# Patient Record
Sex: Female | Born: 1945 | Race: White | Hispanic: No | Marital: Married | Smoking: Never smoker
Health system: Southern US, Community
[De-identification: ages and names within clinical notes are randomized; demographics above are authoritative.]

## PROBLEM LIST (undated history)

## (undated) DIAGNOSIS — I10 Essential (primary) hypertension: Secondary | ICD-10-CM

## (undated) DIAGNOSIS — M1990 Unspecified osteoarthritis, unspecified site: Secondary | ICD-10-CM

## (undated) DIAGNOSIS — E7800 Pure hypercholesterolemia, unspecified: Secondary | ICD-10-CM

## (undated) DIAGNOSIS — K760 Fatty (change of) liver, not elsewhere classified: Secondary | ICD-10-CM

## (undated) DIAGNOSIS — D649 Anemia, unspecified: Secondary | ICD-10-CM

## (undated) DIAGNOSIS — R7303 Prediabetes: Secondary | ICD-10-CM

## (undated) DIAGNOSIS — I447 Left bundle-branch block, unspecified: Secondary | ICD-10-CM

## (undated) DIAGNOSIS — K219 Gastro-esophageal reflux disease without esophagitis: Secondary | ICD-10-CM

---

## 2007-03-11 ENCOUNTER — Encounter: Admission: RE | Admit: 2007-03-11 | Discharge: 2007-03-11 | Payer: Self-pay | Admitting: Internal Medicine

## 2018-10-15 ENCOUNTER — Encounter (INDEPENDENT_AMBULATORY_CARE_PROVIDER_SITE_OTHER): Payer: Self-pay | Admitting: *Deleted

## 2018-12-08 ENCOUNTER — Other Ambulatory Visit (INDEPENDENT_AMBULATORY_CARE_PROVIDER_SITE_OTHER): Payer: Self-pay | Admitting: *Deleted

## 2018-12-08 DIAGNOSIS — Z1211 Encounter for screening for malignant neoplasm of colon: Secondary | ICD-10-CM

## 2018-12-08 DIAGNOSIS — Z800 Family history of malignant neoplasm of digestive organs: Secondary | ICD-10-CM | POA: Insufficient documentation

## 2019-02-10 ENCOUNTER — Telehealth (INDEPENDENT_AMBULATORY_CARE_PROVIDER_SITE_OTHER): Payer: Self-pay | Admitting: *Deleted

## 2019-02-10 ENCOUNTER — Encounter (INDEPENDENT_AMBULATORY_CARE_PROVIDER_SITE_OTHER): Payer: Self-pay | Admitting: *Deleted

## 2019-02-10 ENCOUNTER — Other Ambulatory Visit (INDEPENDENT_AMBULATORY_CARE_PROVIDER_SITE_OTHER): Payer: Self-pay | Admitting: *Deleted

## 2019-02-10 DIAGNOSIS — Z1211 Encounter for screening for malignant neoplasm of colon: Secondary | ICD-10-CM

## 2019-02-10 MED ORDER — SUPREP BOWEL PREP KIT 17.5-3.13-1.6 GM/177ML PO SOLN: 1.0000 | Freq: Once | ORAL | 0 refills | Status: AC

## 2019-02-10 NOTE — Telephone Encounter (Signed)
Patient needs suprep TCS sch'd 12/30

## 2019-03-09 ENCOUNTER — Ambulatory Visit (INDEPENDENT_AMBULATORY_CARE_PROVIDER_SITE_OTHER): Payer: Self-pay

## 2019-03-09 ENCOUNTER — Other Ambulatory Visit: Payer: Self-pay

## 2019-03-09 ENCOUNTER — Telehealth (INDEPENDENT_AMBULATORY_CARE_PROVIDER_SITE_OTHER): Payer: Self-pay | Admitting: *Deleted

## 2019-03-09 NOTE — Telephone Encounter (Signed)
Referring MD/PCP: dianne elliott   Procedure: tcs  Reason/Indication:  Screening, fam hx colon ca  Has patient had this procedure before?  Yes, 2010  If so, when, by whom and where?    Is there a family history of colon cancer?  Yes, aunts & uncles  Who?  What age when diagnosed?    Is patient diabetic?   no      Does patient have prosthetic heart valve or mechanical valve?  no  Do you have a pacemaker/defibrillator?  no  Has patient ever had endocarditis/atrial fibrillation? no  Does patient use oxygen? no  Has patient had joint replacement within last 12 months?  no  Is patient constipated or do they take laxatives? no  Does patient have a history of alcohol/drug use?  no  Is patient on blood thinner such as Coumadin, Plavix and/or Aspirin? yes  Medications: asa 81 mg daily, prevacid 30 mg daily, hctz 25 mg 1/2 tab daily, lisinopril 40 mg bid, carvedilol 12.5 mg bid, women's one a day vitamin, atorvastatin 10 mg daily, calcium 600 mg bid, fish oil daily, miralax daily   Allergies: pcn, codeine, cipro, clindamycin  Medication Adjustment per Dr Laural Golden: asa 2 days  Procedure date & time: 04/08/19 at [id number]

## 2019-03-16 NOTE — Telephone Encounter (Signed)
Colonoscopy with conscious sedation 

## 2019-04-01 ENCOUNTER — Other Ambulatory Visit: Payer: Self-pay

## 2019-04-06 ENCOUNTER — Other Ambulatory Visit (HOSPITAL_COMMUNITY)
Admission: RE | Admit: 2019-04-06 | Discharge: 2019-04-06 | Disposition: A | Discharge status: Home / Self Care | Payer: Medicare Other | Source: Ambulatory Visit | Attending: Internal Medicine | Admitting: Internal Medicine

## 2019-04-06 ENCOUNTER — Other Ambulatory Visit: Payer: Self-pay

## 2019-04-06 DIAGNOSIS — Z01812 Encounter for preprocedural laboratory examination: Secondary | ICD-10-CM | POA: Insufficient documentation

## 2019-04-06 DIAGNOSIS — Z20828 Contact with and (suspected) exposure to other viral communicable diseases: Secondary | ICD-10-CM | POA: Insufficient documentation

## 2019-04-06 LAB — SARS CORONAVIRUS 2 (TAT 6-24 HRS): SARS Coronavirus 2: NEGATIVE

## 2019-04-08 ENCOUNTER — Encounter (HOSPITAL_COMMUNITY)
Admission: RE | Disposition: A | Discharge status: Home / Self Care | Payer: Self-pay | Source: Home / Self Care | Attending: Internal Medicine

## 2019-04-08 ENCOUNTER — Encounter (HOSPITAL_COMMUNITY): Payer: Self-pay | Admitting: Internal Medicine

## 2019-04-08 ENCOUNTER — Other Ambulatory Visit: Payer: Self-pay

## 2019-04-08 ENCOUNTER — Ambulatory Visit (HOSPITAL_COMMUNITY)
Admission: RE | Admit: 2019-04-08 | Discharge: 2019-04-08 | Disposition: A | Discharge status: Home / Self Care | Payer: Medicare Other | Attending: Internal Medicine | Admitting: Internal Medicine

## 2019-04-08 DIAGNOSIS — K621 Rectal polyp: Secondary | ICD-10-CM | POA: Insufficient documentation

## 2019-04-08 DIAGNOSIS — Z79899 Other long term (current) drug therapy: Secondary | ICD-10-CM | POA: Insufficient documentation

## 2019-04-08 DIAGNOSIS — Z881 Allergy status to other antibiotic agents status: Secondary | ICD-10-CM | POA: Diagnosis not present

## 2019-04-08 DIAGNOSIS — I447 Left bundle-branch block, unspecified: Secondary | ICD-10-CM | POA: Diagnosis not present

## 2019-04-08 DIAGNOSIS — Z8249 Family history of ischemic heart disease and other diseases of the circulatory system: Secondary | ICD-10-CM | POA: Insufficient documentation

## 2019-04-08 DIAGNOSIS — Z96653 Presence of artificial knee joint, bilateral: Secondary | ICD-10-CM | POA: Insufficient documentation

## 2019-04-08 DIAGNOSIS — Z7982 Long term (current) use of aspirin: Secondary | ICD-10-CM | POA: Insufficient documentation

## 2019-04-08 DIAGNOSIS — I10 Essential (primary) hypertension: Secondary | ICD-10-CM | POA: Diagnosis not present

## 2019-04-08 DIAGNOSIS — K5730 Diverticulosis of large intestine without perforation or abscess without bleeding: Secondary | ICD-10-CM

## 2019-04-08 DIAGNOSIS — Z800 Family history of malignant neoplasm of digestive organs: Secondary | ICD-10-CM

## 2019-04-08 DIAGNOSIS — Z885 Allergy status to narcotic agent status: Secondary | ICD-10-CM | POA: Insufficient documentation

## 2019-04-08 DIAGNOSIS — K644 Residual hemorrhoidal skin tags: Secondary | ICD-10-CM

## 2019-04-08 DIAGNOSIS — E785 Hyperlipidemia, unspecified: Secondary | ICD-10-CM | POA: Insufficient documentation

## 2019-04-08 DIAGNOSIS — K6289 Other specified diseases of anus and rectum: Secondary | ICD-10-CM | POA: Diagnosis not present

## 2019-04-08 DIAGNOSIS — Z1211 Encounter for screening for malignant neoplasm of colon: Secondary | ICD-10-CM | POA: Insufficient documentation

## 2019-04-08 DIAGNOSIS — K635 Polyp of colon: Secondary | ICD-10-CM

## 2019-04-08 DIAGNOSIS — D122 Benign neoplasm of ascending colon: Secondary | ICD-10-CM | POA: Insufficient documentation

## 2019-04-08 DIAGNOSIS — Z880 Allergy status to penicillin: Secondary | ICD-10-CM | POA: Insufficient documentation

## 2019-04-08 HISTORY — PX: COLONOSCOPY: SHX5424

## 2019-04-08 HISTORY — PX: POLYPECTOMY: SHX5525

## 2019-04-08 HISTORY — DX: Left bundle-branch block, unspecified: I44.7

## 2019-04-08 SURGERY — COLONOSCOPY
Anesthesia: Moderate Sedation

## 2019-04-08 MED ORDER — SODIUM CHLORIDE 0.9 % IV SOLN
INTRAVENOUS | Status: DC
  Administered 2019-04-08: 1000 mL via INTRAVENOUS

## 2019-04-08 MED ORDER — MIDAZOLAM HCL 5 MG/5ML IJ SOLN
INTRAMUSCULAR | Status: AC
  Filled 2019-04-08: qty 10

## 2019-04-08 MED ORDER — MEPERIDINE HCL 50 MG/ML IJ SOLN
INTRAMUSCULAR | Status: DC | PRN
  Administered 2019-04-08 (×2): 25 mg via INTRAVENOUS

## 2019-04-08 MED ORDER — MEPERIDINE HCL 50 MG/ML IJ SOLN
INTRAMUSCULAR | Status: AC
  Filled 2019-04-08: qty 1

## 2019-04-08 MED ORDER — MIDAZOLAM HCL 5 MG/5ML IJ SOLN
INTRAMUSCULAR | Status: DC | PRN
  Administered 2019-04-08: 1 mg via INTRAVENOUS
  Administered 2019-04-08: 2 mg via INTRAVENOUS
  Administered 2019-04-08 (×2): 1 mg via INTRAVENOUS

## 2019-04-08 MED ORDER — STERILE WATER FOR IRRIGATION IR SOLN
Status: DC | PRN
  Administered 2019-04-08: 1.5 mL

## 2019-04-08 NOTE — Discharge Instructions (Signed)
No aspirin or NSAIDs for 24 hours. Resume other medications as before High-fiber diet. No driving for 24 hours. Physician will call with biopsy results.   Colonoscopy, Adult, Care After This sheet gives you information about how to care for yourself after your procedure. Your doctor may also give you more specific instructions. If you have problems or questions, call your doctor. What can I expect after the procedure? After the procedure, it is common to have:  A small amount of blood in your poop for 24 hours.  Some gas.  Mild cramping or bloating in your belly. Follow these instructions at home: General instructions  For the first 24 hours after the procedure: ? Do not drive or use machinery. ? Do not sign important documents. ? Do not drink alcohol. ? Do your daily activities more slowly than normal. ? Eat foods that are soft and easy to digest.  Take over-the-counter or prescription medicines only as told by your doctor. To help cramping and bloating:   Try walking around.  Put heat on your belly (abdomen) as told by your doctor. Use a heat source that your doctor recommends, such as a moist heat pack or a heating pad. ? Put a towel between your skin and the heat source. ? Leave the heat on for 20-30 minutes. ? Remove the heat if your skin turns bright red. This is especially important if you cannot feel pain, heat, or cold. You can get burned. Eating and drinking   Drink enough fluid to keep your pee (urine) clear or pale yellow.  Return to your normal diet as told by your doctor. Avoid heavy or fried foods that are hard to digest.  Avoid drinking alcohol for as long as told by your doctor. Contact a doctor if:  You have blood in your poop (stool) 2-3 days after the procedure. Get help right away if:  You have more than a small amount of blood in your poop.  You see large clumps of tissue (blood clots) in your poop.  Your belly is swollen.  You feel sick  to your stomach (nauseous).  You throw up (vomit).  You have a fever.  You have belly pain that gets worse, and medicine does not help your pain. Summary  After the procedure, it is common to have a small amount of blood in your poop. You may also have mild cramping and bloating in your belly.  For the first 24 hours after the procedure, do not drive or use machinery, do not sign important documents, and do not drink alcohol.  Get help right away if you have a lot of blood in your poop, feel sick to your stomach, have a fever, or have more belly pain. This information is not intended to replace advice given to you by your health care provider. Make sure you discuss any questions you have with your health care provider. Document Released: 04/28/2010 Document Revised: 01/24/2017 Document Reviewed: 12/19/2015 Elsevier Patient Education  2020 [employer].  Diverticulosis  Diverticulosis is a condition that develops when small pouches (diverticula) form in the wall of the large intestine (colon). The colon is where water is absorbed and stool is formed. The pouches form when the inside layer of the colon pushes through weak spots in the outer layers of the colon. You may have a few pouches or many of them. What are the causes? The cause of this condition is not known. What increases the risk? The following factors may make you  more likely to develop this condition:  Being older than age 58. Your risk for this condition increases with age. Diverticulosis is rare among people younger than age 36. By age 14, many people have it.  Eating a low-fiber diet.  Having frequent constipation.  Being overweight.  Not getting enough exercise.  Smoking.  Taking over-the-counter pain medicines, like aspirin and ibuprofen.  Having a family history of diverticulosis. What are the signs or symptoms? In most people, there are no symptoms of this condition. If you do have symptoms, they may  include:  Bloating.  Cramps in the abdomen.  Constipation or diarrhea.  Pain in the lower left side of the abdomen. How is this diagnosed? This condition is most often diagnosed during an exam for other colon problems. Because diverticulosis usually has no symptoms, it often cannot be diagnosed independently. This condition may be diagnosed by:  Using a flexible scope to examine the colon (colonoscopy).  Taking an X-ray of the colon after dye has been put into the colon (barium enema).  Doing a CT scan. How is this treated? You may not need treatment for this condition if you have never developed an infection related to diverticulosis. If you have had an infection before, treatment may include:  Eating a high-fiber diet. This may include eating more fruits, vegetables, and grains.  Taking a fiber supplement.  Taking a live bacteria supplement (probiotic).  Taking medicine to relax your colon.  Taking antibiotic medicines. Follow these instructions at home:  Drink 6-8 glasses of water or more each day to prevent constipation.  Try not to strain when you have a bowel movement.  If you have had an infection before: ? Eat more fiber as directed by your health care provider or your diet and nutrition specialist (dietitian). ? Take a fiber supplement or probiotic, if your health care provider approves.  Take over-the-counter and prescription medicines only as told by your health care provider.  If you were prescribed an antibiotic, take it as told by your health care provider. Do not stop taking the antibiotic even if you start to feel better.  Keep all follow-up visits as told by your health care provider. This is important. Contact a health care provider if:  You have pain in your abdomen.  You have bloating.  You have cramps.  You have not had a bowel movement in 3 days. Get help right away if:  Your pain gets worse.  Your bloating becomes very bad.  You have a  fever or chills, and your symptoms suddenly get worse.  You vomit.  You have bowel movements that are bloody or black.  You have bleeding from your rectum. Summary  Diverticulosis is a condition that develops when small pouches (diverticula) form in the wall of the large intestine (colon).  You may have a few pouches or many of them.  This condition is most often diagnosed during an exam for other colon problems.  If you have had an infection related to diverticulosis, treatment may include increasing the fiber in your diet, taking supplements, or taking medicines. This information is not intended to replace advice given to you by your health care provider. Make sure you discuss any questions you have with your health care provider. Document Released: 12/22/2003 Document Revised: 03/08/2017 Document Reviewed: 02/13/2016 Elsevier Patient Education  2020 [employer].

## 2019-04-08 NOTE — H&P (Addendum)
Ashley Richard is an 73 y.o. female.   Chief Complaint: Patient is here for colonoscopy. HPI: Patient is 73 year old Caucasian female who is here for screening colonoscopy.  Last exam was normal 10 years ago.  She denies abdominal pain change in bowel habits or rectal bleeding.  Family history significant for colon carcinoma in paternal aunt and 2 maternal uncles.  They are all in good in their late 39s or 34s. Last aspirin dose was 3 days ago.  Past Medical History:  Diagnosis Date  . Left bundle branch block         Hypertension       Hyperlipemia  Past Surgical History:  Procedure Laterality Date  . ABDOMINAL HYSTERECTOMY    . APPENDECTOMY    . CHOLECYSTECTOMY    . REPLACEMENT TOTAL KNEE BILATERAL Bilateral     Family History  Problem Relation Age of Onset  . Hypertension Mother   . Emphysema Father   . Stomach cancer Father   . Colon cancer Maternal Uncle   . Colon cancer Maternal Uncle   . Colon cancer Paternal Aunt    Social History:  reports that she has never smoked. She has never used smokeless tobacco. She reports current alcohol use. She reports that she does not use drugs.  Allergies:  Allergies  Allergen Reactions  . Clindamycin Other (See Comments)    Other Reaction: chest pain  . Codeine     Other reaction(s): Other (See Comments) Other Reaction: GI Upset  . Penicillins Rash    Did it involve swelling of the face/tongue/throat, SOB, or low BP? No Did it involve sudden or severe rash/hives, skin peeling, or any reaction on the inside of your mouth or nose? No Did you need to seek medical attention at a hospital or doctor's office? No When did it last happen?Over 10 years If all above answers are "NO", may proceed with cephalosporin use.  . Ciprofloxacin Rash    Medications Prior to Admission  Medication Sig Dispense Refill  . Aspirin Buf,CaCarb-MgCarb-MgO, 81 MG TABS Take 81 mg by mouth at bedtime.    Marland Kitchen atorvastatin (LIPITOR) 20 MG tablet Take 10 mg  by mouth daily.    . calcium carbonate (OSCAL) 1500 (600 Ca) MG TABS tablet Take 600 mg of elemental calcium by mouth 2 (two) times daily with a meal.    . carvedilol (COREG) 12.5 MG tablet Take 25 mg by mouth 2 (two) times daily.    . ferrous sulfate 325 (65 FE) MG tablet Take 325 mg by mouth every evening.    . Glycerin-Hypromellose-PEG 400 (DRY EYE RELIEF DROPS) 0.2-0.2-1 % SOLN Apply 1 drop to eye daily as needed (Dry eye).    . hydrochlorothiazide (HYDRODIURIL) 25 MG tablet Take 25 mg by mouth daily.    . lansoprazole (PREVACID) 30 MG capsule Take 30 mg by mouth daily.    Marland Kitchen lisinopril (ZESTRIL) 20 MG tablet Take 40 mg by mouth daily.    . Multiple Vitamins-Minerals (MULTIVITAMIN WITH MINERALS) tablet Take 1 tablet by mouth daily.    Marland Kitchen omega-3 acid ethyl esters (LOVAZA) 1 g capsule Take 1 g by mouth daily.    . polyethylene glycol (MIRALAX / GLYCOLAX) 17 g packet Take 17 g by mouth at bedtime.      No results found for this or any previous visit (from the past 48 hour(s)). No results found.  Review of Systems  Blood pressure (!) 144/68, pulse 70, temperature 99 F (37.2 C), temperature source Oral,  resp. rate 15, height 5' 3.5" (1.613 m), weight 86.2 kg, SpO2 99 %. Physical Exam  Constitutional: She appears well-developed and well-nourished.  HENT:  Mouth/Throat: Oropharynx is clear and moist.  Eyes: Conjunctivae are normal. No scleral icterus.  Neck: No thyromegaly present.  Cardiovascular: Normal rate and regular rhythm.  Murmur heard. Faint systolic murmur best heard at aortic area.  Respiratory: Effort normal and breath sounds normal.  GI:  Abdomen is full.  Lower midline scar.  Abdomen is soft and nontender with organomegaly or masses.  Musculoskeletal:        General: No edema.  Lymphadenopathy:    She has no cervical adenopathy.  Neurological: She is alert.  Skin: Skin is warm and dry.     Assessment/Plan High risk screening colonoscopy. Family history of colon  carcinoma and multiple second-degree relatives.  Hildred Laser, MD 04/08/2019, 8:40 AM

## 2019-04-08 NOTE — Op Note (Signed)
[facility] Patient Name: Ashley Richard Procedure Date: 04/08/2019 8:21 AM MRN: [id number] Date of Birth: Apr 27, 1945 Attending MD: Hildred Laser , MD CSN: [id number] Age: 73 Admit Type: Outpatient Procedure:                Colonoscopy Indications:              Screening for colorectal malignant neoplasm, Colon                            cancer screening in patient at increased risk:                            Family history of colorectal cancer in multiple 2nd                            degree relatives Providers:                Hildred Laser, MD, Otis Peak B. Sharon Seller, RN, Raphael Gibney, Technician Referring MD:             Laray Anger NP, NP Medicines:                Meperidine 50 mg IV, Midazolam 5 mg IV Complications:            No immediate complications. Estimated Blood Loss:     Estimated blood loss was minimal. Procedure:                Pre-Anesthesia Assessment:                           - Prior to the procedure, a History and Physical                            was performed, and patient medications and                            allergies were reviewed. The patient's tolerance of                            previous anesthesia was also reviewed. The risks                            and benefits of the procedure and the sedation                            options and risks were discussed with the patient.                            All questions were answered, and informed consent                            was obtained. Prior Anticoagulants: The patient has  taken no previous anticoagulant or antiplatelet                            agents except for aspirin. ASA Grade Assessment: II                            - A patient with mild systemic disease. After                            reviewing the risks and benefits, the patient was                            deemed in satisfactory condition to undergo the                procedure.                           After obtaining informed consent, the colonoscope                            was passed under direct vision. Throughout the                            procedure, the patient's blood pressure, pulse, and                            oxygen saturations were monitored continuously. The                            [id number]) was introduced through the                            anus and advanced to the the cecum, identified by                            appendiceal orifice and ileocecal valve. The                            ileocecal valve, appendiceal orifice, and rectum                            were photographed. Scope In: 8:58:32 AM Scope Out: 9:14:45 AM Scope Withdrawal Time: 0 hours 12 minutes 33 seconds  Total Procedure Duration: 0 hours 16 minutes 13 seconds  Findings:      The perianal and digital rectal examinations were normal.      Two polyps were found in the rectum and ascending colon. The polyps were       small in size. These were biopsied with a cold forceps for histology.       The pathology specimen was placed into Bottle Number 1.      Multiple diverticula were found in the sigmoid colon and hepatic flexure.      External hemorrhoids were found during retroflexion. The hemorrhoids       were small.      Anal papilla(e) were hypertrophied. Impression:               -  Two small polyps in the rectum and in the                            ascending colon. Biopsied.                           - Diverticulosis in the sigmoid colon and at the                            hepatic flexure.                           - External hemorrhoids.                           - Anal papilla(e) were hypertrophied. Moderate Sedation:      Moderate (conscious) sedation was administered by the endoscopy nurse       and supervised by the endoscopist. The following parameters were       monitored: oxygen saturation, heart rate, blood  pressure, CO2       capnography and response to care. Total physician intraservice time was       20 minutes. Recommendation:           - Patient has a contact number available for                            emergencies. The signs and symptoms of potential                            delayed complications were discussed with the                            patient. Return to normal activities tomorrow.                            Written discharge instructions were provided to the                            patient.                           - High fiber diet today.                           - Continue present medications.                           - No aspirin, ibuprofen, naproxen, or other                            non-steroidal anti-inflammatory drugs for 1 day.                           - Await pathology results.                           - Repeat colonoscopy  is recommended. The                            colonoscopy date will be determined after pathology                            results from today's exam become available for                            review. Procedure Code(s):        --- Professional ---                           [phone], Colonoscopy, flexible; with biopsy, single                            or multiple                           G0500, Moderate sedation services provided by the                            same physician or other qualified health care                            professional performing a gastrointestinal                            endoscopic service that sedation supports,                            requiring the presence of an independent trained                            observer to assist in the monitoring of the                            patient's level of consciousness and physiological                            status; initial 15 minutes of intra-service time;                            patient age 61 years or older (additional time may                             be reported with [phone], as appropriate) Diagnosis Code(s):        --- Professional ---                           Z12.11, Encounter for screening for malignant                            neoplasm of colon  Z80.0, Family history of malignant neoplasm of                            digestive organs                           K62.1, Rectal polyp                           K63.5, Polyp of colon                           K64.4, Residual hemorrhoidal skin tags                           K62.89, Other specified diseases of anus and rectum                           K57.30, Diverticulosis of large intestine without                            perforation or abscess without bleeding CPT copyright 2019 [employer]. All rights reserved. The codes documented in this report are preliminary and upon coder review may  be revised to meet current compliance requirements. Hildred Laser, MD Hildred Laser, MD 04/08/2019 9:30:08 AM This report has been signed electronically. Number of Addenda: 0

## 2019-04-09 LAB — SURGICAL PATHOLOGY

## 2020-08-16 ENCOUNTER — Other Ambulatory Visit: Payer: Self-pay | Admitting: Neurosurgery

## 2020-08-30 NOTE — H&P (Signed)
Patient ID:   [id number] Patient: Ashley Richard  Date of Birth: 1946/03/22 Visit Type: Office Visit   Date: 07/27/2020 03:15 PM Provider: Marchia Meiers. Vertell Limber MD   This 75 year old female presents for MRI Review.  HISTORY OF PRESENT ILLNESS: 1.  MRI Review  The patient returns to review her lumbar MRI which was obtained on a stat basis because of her perineal numbness.  Her MRI shows a significant degree of spondylolisthesis of L4 on L5 with severe disc degeneration, disc herniation and ligamentous hypertrophy causing severe spinal stenosis at this level.  In addition she has moderately severe disc degeneration at the L5-S1 level which is causing lateral recess stenosis and some foraminal stenosis.  On further discussion with the patient she does not feel that her perineal numbness is worsening and she has no loss of control over her bowel or bladder function.  I would like for her to have cardiology clearance prior to proceeding with surgery.  I have recommended proceeding with decompression and fusion at the L4-5 level and L5-S1 levels.  I have recommended a PLIF procedure be performed.  While she has some mild degenerative changes at the L3-4 level, these are not severe.  The most severe abnormality occurs at L4-5 but she also has lateral recess stenosis at the L5-S1 level which is likely contributing to some of her lumbar radicular complaints.  The patient notes that she has had a normal bone density test recently.  She is currently 75 years of age.  She is scheduled to see her cardiologist in July but we have asked her to see him sooner to get cardiac clearance for surgery.  She has a bundle branch block and was scheduled for an echocardiogram.      Medical/Surgical/Interim History Reviewed, no change.  Last detailed document date:07/20/2020.     PAST MEDICAL HISTORY, SURGICAL HISTORY, FAMILY HISTORY, SOCIAL HISTORY AND REVIEW OF SYSTEMS I have reviewed the patient's past medical,  surgical, family and social history as well as the comprehensive review of systems as included on the [facility] history form dated 07/06/2020, which I have signed.  Family History: Reviewed, no changes.  Last detailed document date:07/20/2020.   Social History: Reviewed, no changes. Last detailed document date: 07/20/2020.    MEDICATIONS: (added, continued or stopped this visit) Started Medication Directions Instruction Stopped  atorvastatin 10 mg tablet take 1 tablet by oral route  every day    Calcium 600 mg calcium (1,500 mg) tablet     carvedilol 25 mg tablet take 1 tablet by oral route 2 times every day with food    Fish oil 1400 BUCCAL     HCTZ     Iron (ferrous sulfate) 325 mg (65 mg iron) tablet take 1 tablet by oral route  every day    lisinopril 40 mg tablet take 1 tablet by oral route  every day    Miralax 17 gram oral powder packet take 1 packet by oral route  every day mixed with 8 oz. water, juice, soda, coffee or tea    Prevacid 30 mg capsule,delayed release take 1 capsule by oral route  every day before a meal    Vazalore 81 mg capsule take 1 capsule by oral route  every day      ALLERGIES: Ingredient Reaction Medication Name Comment PENICILLIN Rash   CODEINE Shortness Of Breath   CLINDAMYCIN Shortness Of Breath   CIPROFLOXACIN HCL  Cipro  CIPROFLOXACIN  Cipro  Reviewed, no changes.    PHYSICAL EXAM:  Vitals Date Temp F BP Pulse Ht In Wt Lb BMI BSA Pain Score 07/27/2020  161/82 61 63 186 32.95  7/10     IMPRESSION:  The patient has severe spinal stenosis of the lumbar spine with spondylolisthesis of L4 on L5 with a central disc herniation and also lateral recess stenosis and disc degeneration at the L5-S1 levels.  PLAN: I have recommended proceeding with decompression and fusion at the L4-5 and L5-S1 levels.  Patient education was performed.   She wishes to proceed with surgery.  She is going to be cleared for surgery by her cardiologist.  Orders: Diagnostic Procedures: Assessment Procedure M54.16 Lumbar Spine- AP/Lat Instruction(s)/Education: Assessment Instruction I10 Lifestyle education [phone] Dietary management education, guidance, and counseling Miscellaneous: Assessment  M43.16 LSO Brace  Completed Orders (this encounter) Order Details Reason Side Interpretation Result Initial Treatment Date Region Lifestyle education Patient will follow up with Primary Care Physician.       Dietary management education, guidance, and counseling Encouraged patient to eat well balanced diet.        Assessment/Plan  # Detail Type Description  1. Assessment Saddle anesthesia (R20.0).     2. Assessment Disc displacement, lumbar (M51.26).     3. Assessment Radiculopathy, lumbar region (M54.16).     4. Assessment Lumbar foraminal stenosis (M48.061).     5. Assessment Spondylolisthesis, lumbar region (M43.16).  Plan Orders LSO Brace.     6. Assessment Essential (primary) hypertension (I10).     7. Assessment Body mass index (BMI) 32.0-32.9, adult (Q00.86).  Plan Orders Today's instructions / counseling include(s) Dietary management education, guidance, and counseling. Clinical information/comments: Encouraged patient to eat well balanced diet.       Pain Management Plan Pain Scale: 7/10. Method: Numeric Pain Intensity Scale. Location: back. Onset: 07/20/2020. Duration: varies. Quality: discomforting. Pain management follow-up plan of care: Patient will continue medication management..              Provider:  Marchia Meiers. Vertell Limber MD  07/28/2020 04:21 PM    Dictation edited by: Marchia Meiers. Vertell Limber    CC Providers: [facility] [address] [place]  [address]-   Zyaire Mccleod MD  [address] [place], [place] [address]               Electronically signed by Marchia Meiers. Vertell Limber MD on 07/28/2020 04:21 PM

## 2020-09-02 NOTE — Pre-Procedure Instructions (Signed)
Surgical Instructions    Your procedure is scheduled on Friday June 3rd.  Report to [facility] Main Entrance "A" at 05:30 A.M., then check in with the Admitting office.  Call this number if you have problems the morning of surgery:  [phone]   If you have any questions prior to your surgery date call [phone]: Open Monday-Friday 8am-4pm    Remember:  Do not eat or drink after midnight the night before your surgery     Take these medicines the morning of surgery with A SIP OF WATER   atorvastatin (LIPITOR)   carvedilol (COREG)  lansoprazole (PREVACID)    As of today, STOP taking any Aspirin (unless otherwise instructed by your surgeon) Aleve, Naproxen, Ibuprofen, Motrin, Advil, Goody's, BC's, all herbal medications, fish oil, and all vitamins.                     Do not wear jewelry, make up, or nail polish DO Not wear nail polish, gel polish, artificial nails, or any other type of covering on natural nails including finger and toenails. If patients have artificial nails, gel coating, etc. that need to be removed by a nail saloon please have this removed prior to surgery or surgery may need to be canceled/delayed if the surgeon/ anesthesia feels like the patient is unable to be adequately monitored.            Do not wear lotions, powders, perfumes/colognes, or deodorant.            Do not shave 48 hours prior to surgery.  Men may shave face and neck.            Do not bring valuables to the hospital.            [facility] is not responsible for any belongings or valuables.  Do NOT Smoke (Tobacco/Vaping) or drink Alcohol 24 hours prior to your procedure If you use a CPAP at night, you may bring all equipment for your overnight stay.   Contacts, glasses, dentures or bridgework may not be worn into surgery, please bring cases for these belongings   For patients admitted to the hospital, discharge time will be determined by your treatment team.   Patients discharged the  day of surgery will not be allowed to drive home, and someone needs to stay with them for 24 hours.    Special instructions:    Oral Hygiene is also important to reduce your risk of infection.  Remember - BRUSH YOUR TEETH THE MORNING OF SURGERY WITH YOUR REGULAR TOOTHPASTE   [facility]- Preparing For Surgery  Before surgery, you can play an important role. Because skin is not sterile, your skin needs to be as free of germs as possible. You can reduce the number of germs on your skin by washing with CHG (chlorahexidine gluconate) Soap before surgery.  CHG is an antiseptic cleaner which kills germs and bonds with the skin to continue killing germs even after washing.     Please do not use if you have an allergy to CHG or antibacterial soaps. If your skin becomes reddened/irritated stop using the CHG.  Do not shave (including legs and underarms) for at least 48 hours prior to first CHG shower. It is OK to shave your face.  Please follow these instructions carefully.    1.  Shower the NIGHT BEFORE SURGERY and the MORNING OF SURGERY with CHG Soap.   If you chose to wash your hair, wash  your hair first as usual with your normal shampoo. After you shampoo, rinse your hair and body thoroughly to remove the shampoo.  Then [employer] and genitals (private parts) with your normal soap and rinse thoroughly to remove soap.  2. After that Use CHG Soap as you would any other liquid soap. You can apply CHG directly to the skin and wash gently with a scrungie or a clean washcloth.   3. Apply the CHG Soap to your body ONLY FROM THE NECK DOWN.  Do not use on open wounds or open sores. Avoid contact with your eyes, ears, mouth and genitals (private parts). Wash Face and genitals (private parts)  with your normal soap.   4. Wash thoroughly, paying special attention to the area where your surgery will be performed.  5. Thoroughly rinse your body with warm water from the neck down.  6. DO NOT shower/wash  with your normal soap after using and rinsing off the CHG Soap.  7. Pat yourself dry with a CLEAN TOWEL.  8. Wear CLEAN PAJAMAS to bed the night before surgery  9. Place CLEAN SHEETS on your bed the night before your surgery  10. DO NOT SLEEP WITH PETS.   Day of Surgery:  Take a shower with CHG soap. Wear Clean/Comfortable clothing the morning of surgery Do not apply any deodorants/lotions.   Remember to brush your teeth WITH YOUR REGULAR TOOTHPASTE.   Please read over the following fact sheets that you were given.

## 2020-09-06 ENCOUNTER — Other Ambulatory Visit: Payer: Self-pay

## 2020-09-06 ENCOUNTER — Encounter (HOSPITAL_COMMUNITY): Payer: Self-pay

## 2020-09-06 ENCOUNTER — Encounter (HOSPITAL_COMMUNITY)
Admission: RE | Admit: 2020-09-06 | Discharge: 2020-09-06 | Disposition: A | Discharge status: Home / Self Care | Payer: Medicare Other | Source: Ambulatory Visit | Attending: Neurosurgery | Admitting: Neurosurgery

## 2020-09-06 DIAGNOSIS — I2510 Atherosclerotic heart disease of native coronary artery without angina pectoris: Secondary | ICD-10-CM | POA: Insufficient documentation

## 2020-09-06 DIAGNOSIS — I110 Hypertensive heart disease with heart failure: Secondary | ICD-10-CM | POA: Insufficient documentation

## 2020-09-06 DIAGNOSIS — I5020 Unspecified systolic (congestive) heart failure: Secondary | ICD-10-CM | POA: Insufficient documentation

## 2020-09-06 DIAGNOSIS — Z01812 Encounter for preprocedural laboratory examination: Secondary | ICD-10-CM | POA: Insufficient documentation

## 2020-09-06 DIAGNOSIS — I447 Left bundle-branch block, unspecified: Secondary | ICD-10-CM | POA: Insufficient documentation

## 2020-09-06 DIAGNOSIS — Z20822 Contact with and (suspected) exposure to covid-19: Secondary | ICD-10-CM | POA: Insufficient documentation

## 2020-09-06 DIAGNOSIS — E785 Hyperlipidemia, unspecified: Secondary | ICD-10-CM | POA: Insufficient documentation

## 2020-09-06 DIAGNOSIS — I739 Peripheral vascular disease, unspecified: Secondary | ICD-10-CM | POA: Insufficient documentation

## 2020-09-06 DIAGNOSIS — I083 Combined rheumatic disorders of mitral, aortic and tricuspid valves: Secondary | ICD-10-CM | POA: Insufficient documentation

## 2020-09-06 HISTORY — DX: Pure hypercholesterolemia, unspecified: E78.00

## 2020-09-06 HISTORY — DX: Prediabetes: R73.03

## 2020-09-06 HISTORY — DX: Gastro-esophageal reflux disease without esophagitis: K21.9

## 2020-09-06 HISTORY — DX: Fatty (change of) liver, not elsewhere classified: K76.0

## 2020-09-06 HISTORY — DX: Unspecified osteoarthritis, unspecified site: M19.90

## 2020-09-06 HISTORY — DX: Anemia, unspecified: D64.9

## 2020-09-06 HISTORY — DX: Essential (primary) hypertension: I10

## 2020-09-06 LAB — CBC
HCT: 41 % (ref 36.0–46.0)
Hemoglobin: 13.7 g/dL (ref 12.0–15.0)
MCH: 30.6 pg (ref 26.0–34.0)
MCHC: 33.4 g/dL (ref 30.0–36.0)
MCV: 91.7 fL (ref 80.0–100.0)
Platelets: 274 10*3/uL (ref 150–400)
RBC: 4.47 MIL/uL (ref 3.87–5.11)
RDW: 12.6 % (ref 11.5–15.5)
WBC: 8.8 10*3/uL (ref 4.0–10.5)
nRBC: 0 % (ref 0.0–0.2)

## 2020-09-06 LAB — COMPREHENSIVE METABOLIC PANEL
ALT: 23 U/L (ref 0–44)
AST: 23 U/L (ref 15–41)
Albumin: 3.6 g/dL (ref 3.5–5.0)
Alkaline Phosphatase: 73 U/L (ref 38–126)
Anion gap: 6 (ref 5–15)
BUN: 16 mg/dL (ref 8–23)
CO2: 30 mmol/L (ref 22–32)
Calcium: 9.4 mg/dL (ref 8.9–10.3)
Chloride: 102 mmol/L (ref 98–111)
Creatinine, Ser: 0.65 mg/dL (ref 0.44–1.00)
GFR, Estimated: >60 mL/min (ref >60)
Glucose, Bld: 137 mg/dL — ABNORMAL HIGH (ref 70–99)
Potassium: 3.7 mmol/L (ref 3.5–5.1)
Sodium: 138 mmol/L (ref 135–145)
Total Bilirubin: 0.5 mg/dL (ref 0.3–1.2)
Total Protein: 6.7 g/dL (ref 6.5–8.1)

## 2020-09-06 LAB — SARS CORONAVIRUS 2 (TAT 6-24 HRS): SARS Coronavirus 2: NEGATIVE

## 2020-09-06 LAB — TYPE AND SCREEN
ABO/RH(D): O NEG
Antibody Screen: NEGATIVE

## 2020-09-06 LAB — SURGICAL PCR SCREEN
MRSA, PCR: NEGATIVE
Staphylococcus aureus: NEGATIVE

## 2020-09-06 NOTE — Progress Notes (Signed)
PCP - Dr. [facility] Cardiologist - Dr. Rosalita Chessman, [facility]  PPM/ICD - n/a Device Orders -  Rep Notified -   Chest x-ray - n/a EKG - patient states she has had one in the last year by Cardiology.  I have requested a tracing Stress Test - patient states she has had one.  The last shown in care everywhere is from 2013.  Requested records for the most recent ECHO - patient states she has echos every so often and has an appointment for one later in the summer.  The last shown in care everywhere is from 2013.  Requested records for the most recent Cardiac Cath - patient states she has had a cath in the past but is not sure when.  I have also requested records from cardiology  Sleep Study - patient denies CPAP -   Fasting Blood Sugar - n/a Checks Blood Sugar _____ times a day  Blood Thinner Instructions: n/a Aspirin Instructions: last dose was on 09/02/20  ERAS Protcol - npo order PRE-SURGERY Ensure or G2-   COVID TEST- at PAT appointment 09/06/20   Anesthesia review: yes, patient has history of cardiac testing - results requested   Patient denies shortness of breath, fever, cough and chest pain at PAT appointment   All instructions explained to the patient, with a verbal understanding of the material. Patient agrees to go over the instructions while at home for a better understanding. Patient also instructed to self quarantine after being tested for COVID-19. The opportunity to ask questions was provided.

## 2020-09-08 ENCOUNTER — Encounter (HOSPITAL_COMMUNITY): Payer: Self-pay | Admitting: Neurosurgery

## 2020-09-08 NOTE — Anesthesia Preprocedure Evaluation (Addendum)
Anesthesia Evaluation  Patient identified by MRN, date of birth, ID band Patient awake    Reviewed: Allergy & Precautions, NPO status , Patient's Chart, lab work & pertinent test results, reviewed documented beta blocker date and time   Airway Mallampati: II  TM Distance: >3 FB Neck ROM: Full    Dental  (+) Partial Lower, Caps, Dental Advisory Given,    Pulmonary neg pulmonary ROS,    Pulmonary exam normal breath sounds clear to auscultation       Cardiovascular hypertension, Pt. on medications and Pt. on home beta blockers +CHF  + dysrhythmias  Rhythm:Regular Rate:Normal  Hx/o LBBB  Hx/o HFrEF   Neuro/Psych negative neurological ROS  negative psych ROS   GI/Hepatic Neg liver ROS, GERD  Medicated and Controlled,  Endo/Other  Hyperlipidemia Obesity  Renal/GU negative Renal ROS  negative genitourinary   Musculoskeletal  (+) Arthritis , Osteoarthritis,  Spondylolisthesis L4-5, L5-S1   Abdominal (+) + obese,   Peds  Hematology  (+) anemia ,   Anesthesia Other Findings   Reproductive/Obstetrics                           Anesthesia Physical Anesthesia Plan  ASA: III  Anesthesia Plan: General   Post-op Pain Management:    Induction: Intravenous  PONV Risk Score and Plan: 4 or greater and Treatment may vary due to age or medical condition, Ondansetron and Dexamethasone  Airway Management Planned: Oral ETT  Additional Equipment:   Intra-op Plan:   Post-operative Plan: Extubation in OR  Informed Consent: I have reviewed the patients History and Physical, chart, labs and discussed the procedure including the risks, benefits and alternatives for the proposed anesthesia with the patient or authorized representative who has indicated his/her understanding and acceptance.     Dental advisory given  Plan Discussed with: CRNA and Anesthesiologist  Anesthesia Plan Comments: (PAT note by  Karoline Caldwell, PA-C: Follows with cardiologist Dr. 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  She was last seen 05/04/2020, no changes to management.  Per note, last echo 01/03/2017 showed EF 50%, LVH, grade 1 diastolic dysfunction, mild MR, mild AI, and mild TR.  He cleared the patient for surgery per note dated 08/16/2020 stating, "this is to inform you that the above-mentioned patient was followed by me and has been cleared for her upcoming surgery for L4-5/L5-S1 posterior lumbar interbody fusion.  She is not taking any blood thinners.  Please nested to call me if I can be of further assistance."  Copy of clearance on chart.  Preop labs reviewed, unremarkable.  EKG 05/04/2020 (copy on chart): Sinus rhythm.  Rate 74.  Left bundle branch block and left axis.   )      Anesthesia Quick Evaluation

## 2020-09-08 NOTE — Progress Notes (Signed)
Anesthesia Chart Review:  Follows with cardiologist Dr. 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  She was last seen 05/04/2020, no changes to management.  Per note, last echo 01/03/2017 showed EF 50%, LVH, grade 1 diastolic dysfunction, mild MR, mild AI, and mild TR.  He cleared the patient for surgery per note dated 08/16/2020 stating, "this is to inform you that the above-mentioned patient was followed by me and has been cleared for her upcoming surgery for L4-5/L5-S1 posterior lumbar interbody fusion.  She is not taking any blood thinners.  Please nested to call me if I can be of further assistance."  Copy of clearance on chart.  Preop labs reviewed, unremarkable.  EKG 05/04/2020 (copy on chart): Sinus rhythm.  Rate 74.  Left bundle branch block and left axis.  Wynonia Musty [facility] Stay [facility]/Anesthesiology Phone [phone] 09/08/2020 1:49 PM

## 2020-09-09 ENCOUNTER — Inpatient Hospital Stay (HOSPITAL_COMMUNITY): Payer: Medicare Other | Admitting: Anesthesiology

## 2020-09-09 ENCOUNTER — Other Ambulatory Visit: Payer: Self-pay

## 2020-09-09 ENCOUNTER — Encounter (HOSPITAL_COMMUNITY): Payer: Self-pay | Admitting: Neurosurgery

## 2020-09-09 ENCOUNTER — Inpatient Hospital Stay (HOSPITAL_COMMUNITY): Payer: Medicare Other

## 2020-09-09 ENCOUNTER — Encounter (HOSPITAL_COMMUNITY)
Admission: RE | Disposition: A | Discharge status: Home / Self Care | Payer: Self-pay | Source: Home / Self Care | Attending: Neurosurgery

## 2020-09-09 ENCOUNTER — Inpatient Hospital Stay (HOSPITAL_COMMUNITY)
Admission: RE | Admit: 2020-09-09 | Discharge: 2020-09-10 | DRG: 454 | Disposition: A | Discharge status: Home / Self Care | Payer: Medicare Other | Attending: Neurosurgery | Admitting: Neurosurgery

## 2020-09-09 ENCOUNTER — Inpatient Hospital Stay (HOSPITAL_COMMUNITY): Payer: Medicare Other | Admitting: Physician Assistant

## 2020-09-09 DIAGNOSIS — M5116 Intervertebral disc disorders with radiculopathy, lumbar region: Secondary | ICD-10-CM | POA: Diagnosis present

## 2020-09-09 DIAGNOSIS — I454 Nonspecific intraventricular block: Secondary | ICD-10-CM | POA: Diagnosis present

## 2020-09-09 DIAGNOSIS — E669 Obesity, unspecified: Secondary | ICD-10-CM | POA: Diagnosis present

## 2020-09-09 DIAGNOSIS — M4317 Spondylolisthesis, lumbosacral region: Secondary | ICD-10-CM | POA: Diagnosis present

## 2020-09-09 DIAGNOSIS — M4316 Spondylolisthesis, lumbar region: Secondary | ICD-10-CM | POA: Diagnosis present

## 2020-09-09 DIAGNOSIS — K219 Gastro-esophageal reflux disease without esophagitis: Secondary | ICD-10-CM | POA: Diagnosis present

## 2020-09-09 DIAGNOSIS — Z972 Presence of dental prosthetic device (complete) (partial): Secondary | ICD-10-CM | POA: Diagnosis not present

## 2020-09-09 DIAGNOSIS — Z419 Encounter for procedure for purposes other than remedying health state, unspecified: Secondary | ICD-10-CM

## 2020-09-09 DIAGNOSIS — Z20822 Contact with and (suspected) exposure to covid-19: Secondary | ICD-10-CM | POA: Diagnosis present

## 2020-09-09 DIAGNOSIS — G834 Cauda equina syndrome: Secondary | ICD-10-CM | POA: Diagnosis present

## 2020-09-09 DIAGNOSIS — M5117 Intervertebral disc disorders with radiculopathy, lumbosacral region: Secondary | ICD-10-CM | POA: Diagnosis present

## 2020-09-09 DIAGNOSIS — M4807 Spinal stenosis, lumbosacral region: Secondary | ICD-10-CM | POA: Diagnosis present

## 2020-09-09 DIAGNOSIS — E785 Hyperlipidemia, unspecified: Secondary | ICD-10-CM | POA: Diagnosis present

## 2020-09-09 DIAGNOSIS — I5022 Chronic systolic (congestive) heart failure: Secondary | ICD-10-CM | POA: Diagnosis present

## 2020-09-09 DIAGNOSIS — M48061 Spinal stenosis, lumbar region without neurogenic claudication: Principal | ICD-10-CM | POA: Diagnosis present

## 2020-09-09 DIAGNOSIS — Z6832 Body mass index (BMI) 32.0-32.9, adult: Secondary | ICD-10-CM

## 2020-09-09 DIAGNOSIS — I110 Hypertensive heart disease with heart failure: Secondary | ICD-10-CM | POA: Diagnosis present

## 2020-09-09 LAB — ABO/RH: ABO/RH(D): O NEG

## 2020-09-09 SURGERY — POSTERIOR LUMBAR FUSION 2 LEVEL
Anesthesia: General | Site: Spine Lumbar

## 2020-09-09 MED ORDER — CHLORHEXIDINE GLUCONATE CLOTH 2 % EX PADS: 6.0000 | MEDICATED_PAD | Freq: Once | CUTANEOUS | Status: DC

## 2020-09-09 MED ORDER — HYDROCHLOROTHIAZIDE 25 MG PO TABS
25.0000 mg | ORAL_TABLET | Freq: Every morning | ORAL | Status: DC
  Administered 2020-09-10: 25 mg via ORAL
  Filled 2020-09-09: qty 1

## 2020-09-09 MED ORDER — POLYETHYLENE GLYCOL 3350 17 G PO PACK
17.0000 g | PACK | Freq: Every day | ORAL | Status: DC
  Filled 2020-09-09: qty 1

## 2020-09-09 MED ORDER — ROCURONIUM BROMIDE 10 MG/ML (PF) SYRINGE
PREFILLED_SYRINGE | INTRAVENOUS | Status: DC | PRN
  Administered 2020-09-09: 20 mg via INTRAVENOUS
  Administered 2020-09-09 (×2): 10 mg via INTRAVENOUS
  Administered 2020-09-09: 60 mg via INTRAVENOUS

## 2020-09-09 MED ORDER — HYDROMORPHONE HCL 1 MG/ML IJ SOLN
INTRAMUSCULAR | Status: AC
  Filled 2020-09-09: qty 1

## 2020-09-09 MED ORDER — LISINOPRIL 20 MG PO TABS
40.0000 mg | ORAL_TABLET | Freq: Every morning | ORAL | Status: DC
  Administered 2020-09-10: 40 mg via ORAL
  Filled 2020-09-09: qty 2

## 2020-09-09 MED ORDER — ALUM & MAG HYDROXIDE-SIMETH 200-200-20 MG/5ML PO SUSP: 30.0000 mL | Freq: Four times a day (QID) | ORAL | Status: DC | PRN

## 2020-09-09 MED ORDER — PROPOFOL 10 MG/ML IV BOLUS
INTRAVENOUS | Status: AC
  Filled 2020-09-09: qty 20

## 2020-09-09 MED ORDER — CHLORHEXIDINE GLUCONATE 0.12 % MT SOLN: 15.0000 mL | Freq: Once | OROMUCOSAL | Status: AC

## 2020-09-09 MED ORDER — DEXAMETHASONE SODIUM PHOSPHATE 10 MG/ML IJ SOLN
INTRAMUSCULAR | Status: DC | PRN
  Administered 2020-09-09: 10 mg via INTRAVENOUS

## 2020-09-09 MED ORDER — VANCOMYCIN HCL 1000 MG/200ML IV SOLN
1000.0000 mg | Freq: Once | INTRAVENOUS | Status: AC
  Administered 2020-09-09: 1000 mg via INTRAVENOUS
  Filled 2020-09-09: qty 200

## 2020-09-09 MED ORDER — OXYCODONE HCL 5 MG PO TABS
5.0000 mg | ORAL_TABLET | ORAL | Status: DC | PRN
  Administered 2020-09-09: 10 mg via ORAL
  Administered 2020-09-10 (×2): 5 mg via ORAL
  Filled 2020-09-09: qty 2
  Filled 2020-09-09 (×2): qty 1

## 2020-09-09 MED ORDER — METHOCARBAMOL 500 MG PO TABS
500.0000 mg | ORAL_TABLET | Freq: Four times a day (QID) | ORAL | Status: DC | PRN
  Administered 2020-09-09 – 2020-09-10 (×2): 500 mg via ORAL
  Filled 2020-09-09 (×2): qty 1

## 2020-09-09 MED ORDER — BUPIVACAINE LIPOSOME 1.3 % IJ SUSP
INTRAMUSCULAR | Status: DC | PRN
  Administered 2020-09-09: 20 mL

## 2020-09-09 MED ORDER — BUPIVACAINE LIPOSOME 1.3 % IJ SUSP
INTRAMUSCULAR | Status: AC
  Filled 2020-09-09: qty 20

## 2020-09-09 MED ORDER — ONDANSETRON HCL 4 MG PO TABS: 4.0000 mg | ORAL_TABLET | Freq: Four times a day (QID) | ORAL | Status: DC | PRN

## 2020-09-09 MED ORDER — HYDROCODONE-ACETAMINOPHEN 5-325 MG PO TABS: 2.0000 | ORAL_TABLET | ORAL | Status: DC | PRN

## 2020-09-09 MED ORDER — ONDANSETRON HCL 4 MG/2ML IJ SOLN
INTRAMUSCULAR | Status: DC | PRN
  Administered 2020-09-09: 4 mg via INTRAVENOUS

## 2020-09-09 MED ORDER — SODIUM CHLORIDE 0.9% FLUSH: 3.0000 mL | INTRAVENOUS | Status: DC | PRN

## 2020-09-09 MED ORDER — LACTATED RINGERS IV SOLN: INTRAVENOUS | Status: DC

## 2020-09-09 MED ORDER — BUPIVACAINE HCL (PF) 0.5 % IJ SOLN
INTRAMUSCULAR | Status: AC
  Filled 2020-09-09: qty 30

## 2020-09-09 MED ORDER — THROMBIN 20000 UNITS EX SOLR
CUTANEOUS | Status: DC | PRN
  Administered 2020-09-09: 20 mL via TOPICAL

## 2020-09-09 MED ORDER — PROPOFOL 10 MG/ML IV BOLUS
INTRAVENOUS | Status: DC | PRN
  Administered 2020-09-09: 140 mg via INTRAVENOUS

## 2020-09-09 MED ORDER — LIDOCAINE-EPINEPHRINE 1 %-1:100000 IJ SOLN
INTRAMUSCULAR | Status: AC
  Filled 2020-09-09: qty 1

## 2020-09-09 MED ORDER — HEMOSTATIC AGENTS (NO CHARGE) OPTIME: TOPICAL | Status: DC | PRN

## 2020-09-09 MED ORDER — LIDOCAINE 2% (20 MG/ML) 5 ML SYRINGE
INTRAMUSCULAR | Status: AC
  Filled 2020-09-09: qty 5

## 2020-09-09 MED ORDER — THROMBIN 5000 UNITS EX SOLR
OROMUCOSAL | Status: DC | PRN
  Administered 2020-09-09: 5 mL via TOPICAL

## 2020-09-09 MED ORDER — THROMBIN 5000 UNITS EX SOLR
CUTANEOUS | Status: AC
  Filled 2020-09-09: qty 5000

## 2020-09-09 MED ORDER — MENTHOL 3 MG MT LOZG: 1.0000 | LOZENGE | OROMUCOSAL | Status: DC | PRN

## 2020-09-09 MED ORDER — POLYETHYLENE GLYCOL 3350 17 G PO PACK
17.0000 g | PACK | Freq: Every day | ORAL | Status: DC | PRN
  Administered 2020-09-09: 17 g via ORAL

## 2020-09-09 MED ORDER — VANCOMYCIN HCL IN DEXTROSE 1-5 GM/200ML-% IV SOLN
INTRAVENOUS | Status: AC
  Administered 2020-09-09: 1000 mg via INTRAVENOUS
  Filled 2020-09-09: qty 200

## 2020-09-09 MED ORDER — CHLORHEXIDINE GLUCONATE 0.12 % MT SOLN
OROMUCOSAL | Status: AC
  Administered 2020-09-09: 15 mL via OROMUCOSAL
  Filled 2020-09-09: qty 15

## 2020-09-09 MED ORDER — OXYCODONE HCL 5 MG PO TABS: 5.0000 mg | ORAL_TABLET | ORAL | Status: DC | PRN

## 2020-09-09 MED ORDER — KETOROLAC TROMETHAMINE 15 MG/ML IJ SOLN
7.5000 mg | Freq: Four times a day (QID) | INTRAMUSCULAR | Status: AC
  Administered 2020-09-09 – 2020-09-10 (×4): 7.5 mg via INTRAVENOUS
  Filled 2020-09-09 (×4): qty 1

## 2020-09-09 MED ORDER — PANTOPRAZOLE SODIUM 40 MG PO TBEC
40.0000 mg | DELAYED_RELEASE_TABLET | Freq: Every day | ORAL | Status: DC
  Administered 2020-09-10: 40 mg via ORAL
  Filled 2020-09-09: qty 1

## 2020-09-09 MED ORDER — METOCLOPRAMIDE HCL 5 MG/ML IJ SOLN
INTRAMUSCULAR | Status: AC
  Filled 2020-09-09: qty 2

## 2020-09-09 MED ORDER — ONDANSETRON HCL 4 MG/2ML IJ SOLN: 4.0000 mg | Freq: Four times a day (QID) | INTRAMUSCULAR | Status: DC | PRN

## 2020-09-09 MED ORDER — ROCURONIUM BROMIDE 10 MG/ML (PF) SYRINGE
PREFILLED_SYRINGE | INTRAVENOUS | Status: AC
  Filled 2020-09-09: qty 10

## 2020-09-09 MED ORDER — CARVEDILOL 25 MG PO TABS
25.0000 mg | ORAL_TABLET | Freq: Two times a day (BID) | ORAL | Status: DC
  Administered 2020-09-09 – 2020-09-10 (×2): 25 mg via ORAL
  Filled 2020-09-09 (×2): qty 1

## 2020-09-09 MED ORDER — ACETAMINOPHEN 650 MG RE SUPP: 650.0000 mg | RECTAL | Status: DC | PRN

## 2020-09-09 MED ORDER — ORAL CARE MOUTH RINSE: 15.0000 mL | Freq: Once | OROMUCOSAL | Status: DC

## 2020-09-09 MED ORDER — ZOLPIDEM TARTRATE 5 MG PO TABS
5.0000 mg | ORAL_TABLET | Freq: Every evening | ORAL | Status: DC | PRN
Start: 2020-09-09 — End: 2020-09-10

## 2020-09-09 MED ORDER — LIDOCAINE 2% (20 MG/ML) 5 ML SYRINGE
INTRAMUSCULAR | Status: DC | PRN
  Administered 2020-09-09: 60 mg via INTRAVENOUS

## 2020-09-09 MED ORDER — THROMBIN 20000 UNITS EX SOLR
CUTANEOUS | Status: AC
  Filled 2020-09-09: qty 20000

## 2020-09-09 MED ORDER — DEXAMETHASONE SODIUM PHOSPHATE 10 MG/ML IJ SOLN
INTRAMUSCULAR | Status: AC
  Filled 2020-09-09: qty 2

## 2020-09-09 MED ORDER — GLYCOPYRROLATE PF 0.2 MG/ML IJ SOSY
PREFILLED_SYRINGE | INTRAMUSCULAR | Status: DC | PRN
  Administered 2020-09-09: .1 mg via INTRAVENOUS

## 2020-09-09 MED ORDER — DOCUSATE SODIUM 100 MG PO CAPS
100.0000 mg | ORAL_CAPSULE | Freq: Two times a day (BID) | ORAL | Status: DC
  Administered 2020-09-09 – 2020-09-10 (×2): 100 mg via ORAL
  Filled 2020-09-09 (×2): qty 1

## 2020-09-09 MED ORDER — PHENYLEPHRINE HCL-NACL 10-0.9 MG/250ML-% IV SOLN
INTRAVENOUS | Status: DC | PRN
  Administered 2020-09-09: 35 ug/min via INTRAVENOUS

## 2020-09-09 MED ORDER — KCL IN DEXTROSE-NACL 20-5-0.45 MEQ/L-%-% IV SOLN: INTRAVENOUS | Status: DC

## 2020-09-09 MED ORDER — METHOCARBAMOL 1000 MG/10ML IJ SOLN
500.0000 mg | Freq: Four times a day (QID) | INTRAVENOUS | Status: DC | PRN
  Filled 2020-09-09: qty 5

## 2020-09-09 MED ORDER — VANCOMYCIN HCL IN DEXTROSE 1-5 GM/200ML-% IV SOLN: 1000.0000 mg | INTRAVENOUS | Status: AC

## 2020-09-09 MED ORDER — FERROUS SULFATE 325 (65 FE) MG PO TABS
325.0000 mg | ORAL_TABLET | Freq: Every morning | ORAL | Status: DC
  Administered 2020-09-10: 325 mg via ORAL
  Filled 2020-09-09: qty 1

## 2020-09-09 MED ORDER — HYDROMORPHONE HCL 1 MG/ML IJ SOLN: 0.5000 mg | INTRAMUSCULAR | Status: DC | PRN

## 2020-09-09 MED ORDER — HYDROMORPHONE HCL 1 MG/ML IJ SOLN
0.2500 mg | INTRAMUSCULAR | Status: DC | PRN
  Administered 2020-09-09 (×2): 0.25 mg via INTRAVENOUS

## 2020-09-09 MED ORDER — FLEET ENEMA 7-19 GM/118ML RE ENEM: 1.0000 | ENEMA | Freq: Once | RECTAL | Status: DC | PRN

## 2020-09-09 MED ORDER — EPHEDRINE SULFATE-NACL 50-0.9 MG/10ML-% IV SOSY
PREFILLED_SYRINGE | INTRAVENOUS | Status: DC | PRN
  Administered 2020-09-09: 10 mg via INTRAVENOUS
  Administered 2020-09-09: 7.5 mg via INTRAVENOUS
  Administered 2020-09-09 (×2): 10 mg via INTRAVENOUS

## 2020-09-09 MED ORDER — SODIUM CHLORIDE 0.9 % IV SOLN
250.0000 mL | INTRAVENOUS | Status: DC
  Administered 2020-09-09: 250 mL via INTRAVENOUS

## 2020-09-09 MED ORDER — PHENOL 1.4 % MT LIQD: 1.0000 | OROMUCOSAL | Status: DC | PRN

## 2020-09-09 MED ORDER — 0.9 % SODIUM CHLORIDE (POUR BTL) OPTIME
TOPICAL | Status: DC | PRN
  Administered 2020-09-09: 1000 mL

## 2020-09-09 MED ORDER — HYDROXYZINE HCL 50 MG/ML IM SOLN: 50.0000 mg | Freq: Four times a day (QID) | INTRAMUSCULAR | Status: DC | PRN

## 2020-09-09 MED ORDER — ONDANSETRON HCL 4 MG/2ML IJ SOLN
INTRAMUSCULAR | Status: AC
  Filled 2020-09-09: qty 2

## 2020-09-09 MED ORDER — ORAL CARE MOUTH RINSE: 15.0000 mL | Freq: Once | OROMUCOSAL | Status: AC

## 2020-09-09 MED ORDER — CHLORHEXIDINE GLUCONATE 0.12 % MT SOLN: 15.0000 mL | Freq: Once | OROMUCOSAL | Status: DC

## 2020-09-09 MED ORDER — LIDOCAINE-EPINEPHRINE 1 %-1:100000 IJ SOLN
INTRAMUSCULAR | Status: DC | PRN
  Administered 2020-09-09: 10 mL

## 2020-09-09 MED ORDER — ACETAMINOPHEN 325 MG PO TABS
650.0000 mg | ORAL_TABLET | ORAL | Status: DC | PRN
Start: 2020-09-09 — End: 2020-09-10

## 2020-09-09 MED ORDER — ATORVASTATIN CALCIUM 10 MG PO TABS
10.0000 mg | ORAL_TABLET | Freq: Every evening | ORAL | Status: DC
  Administered 2020-09-09: 10 mg via ORAL
  Filled 2020-09-09: qty 1

## 2020-09-09 MED ORDER — SUFENTANIL CITRATE 50 MCG/ML IV SOLN
INTRAVENOUS | Status: AC
  Filled 2020-09-09: qty 1

## 2020-09-09 MED ORDER — SODIUM CHLORIDE 0.9% FLUSH
3.0000 mL | Freq: Two times a day (BID) | INTRAVENOUS | Status: DC
  Administered 2020-09-09: 3 mL via INTRAVENOUS

## 2020-09-09 MED ORDER — BUPIVACAINE HCL (PF) 0.5 % IJ SOLN
INTRAMUSCULAR | Status: DC | PRN
  Administered 2020-09-09: 10 mL

## 2020-09-09 MED ORDER — BISACODYL 10 MG RE SUPP: 10.0000 mg | Freq: Every day | RECTAL | Status: DC | PRN

## 2020-09-09 MED ORDER — EPHEDRINE 5 MG/ML INJ
INTRAVENOUS | Status: AC
  Filled 2020-09-09: qty 10

## 2020-09-09 MED ORDER — SUGAMMADEX SODIUM 200 MG/2ML IV SOLN
INTRAVENOUS | Status: DC | PRN
  Administered 2020-09-09: 200 mg via INTRAVENOUS

## 2020-09-09 MED ORDER — SUFENTANIL CITRATE 50 MCG/ML IV SOLN
INTRAVENOUS | Status: DC | PRN
  Administered 2020-09-09 (×2): 5 ug via INTRAVENOUS
  Administered 2020-09-09: 10 ug via INTRAVENOUS

## 2020-09-09 MED ORDER — ADULT MULTIVITAMIN W/MINERALS CH
1.0000 | ORAL_TABLET | Freq: Every morning | ORAL | Status: DC
  Administered 2020-09-10: 1 via ORAL
  Filled 2020-09-09: qty 1

## 2020-09-09 MED ORDER — PANTOPRAZOLE SODIUM 40 MG IV SOLR: 40.0000 mg | Freq: Every day | INTRAVENOUS | Status: DC

## 2020-09-09 MED ORDER — METOCLOPRAMIDE HCL 5 MG/ML IJ SOLN
10.0000 mg | Freq: Once | INTRAMUSCULAR | Status: AC | PRN
  Administered 2020-09-09: 10 mg via INTRAVENOUS

## 2020-09-09 SURGICAL SUPPLY — 74 items
BASKET BONE COLLECTION (BASKET) ×2 IMPLANT
BLADE CLIPPER SURG (BLADE) IMPLANT
BONE CANC CHIPS 20CC PCAN1/4 (Bone Implant) ×2 IMPLANT
BUR MATCHSTICK NEURO 3.0 LAGG (BURR) ×2 IMPLANT
BUR PRECISION FLUTE 5.0 (BURR) ×2 IMPLANT
CAGE MAS PLIF 9X9X23-8 LUMBAR (Cage) ×8 IMPLANT
CANISTER SUCT 3000ML PPV (MISCELLANEOUS) ×2 IMPLANT
CARTRIDGE OIL MAESTRO DRILL (MISCELLANEOUS) ×1 IMPLANT
CHIPS CANC BONE 20CC PCAN1/4 (Bone Implant) ×1 IMPLANT
CNTNR URN SCR LID CUP LEK RST (MISCELLANEOUS) ×1 IMPLANT
CONT SPEC 4OZ STRL OR WHT (MISCELLANEOUS) ×1
COVER BACK TABLE 60X90IN (DRAPES) ×2 IMPLANT
COVER WAND RF STERILE (DRAPES) ×2 IMPLANT
DECANTER SPIKE VIAL GLASS SM (MISCELLANEOUS) ×2 IMPLANT
DERMABOND ADVANCED (GAUZE/BANDAGES/DRESSINGS) ×1
DERMABOND ADVANCED .7 DNX12 (GAUZE/BANDAGES/DRESSINGS) ×1 IMPLANT
DIFFUSER DRILL AIR PNEUMATIC (MISCELLANEOUS) ×2 IMPLANT
DRAPE C-ARM 42X72 X-RAY (DRAPES) ×2 IMPLANT
DRAPE C-ARMOR (DRAPES) ×2 IMPLANT
DRAPE LAPAROTOMY 100X72X124 (DRAPES) ×2 IMPLANT
DRAPE SURG 17X23 STRL (DRAPES) ×2 IMPLANT
DRSG OPSITE POSTOP 4X6 (GAUZE/BANDAGES/DRESSINGS) ×2 IMPLANT
DURAPREP 26ML APPLICATOR (WOUND CARE) ×2 IMPLANT
ELECT BLADE 4.0 EZ CLEAN MEGAD (MISCELLANEOUS) ×2
ELECT REM PT RETURN 9FT ADLT (ELECTROSURGICAL) ×2
ELECTRODE BLDE 4.0 EZ CLN MEGD (MISCELLANEOUS) ×1 IMPLANT
ELECTRODE REM PT RTRN 9FT ADLT (ELECTROSURGICAL) ×1 IMPLANT
EVACUATOR 1/8 PVC DRAIN (DRAIN) IMPLANT
GAUZE 4X4 16PLY RFD (DISPOSABLE) IMPLANT
GAUZE SPONGE 4X4 12PLY STRL (GAUZE/BANDAGES/DRESSINGS) ×2 IMPLANT
GLOVE BIO SURGEON STRL SZ8 (GLOVE) ×4 IMPLANT
GLOVE BIOGEL PI IND STRL 8.5 (GLOVE) ×2 IMPLANT
GLOVE BIOGEL PI INDICATOR 8.5 (GLOVE) ×2
GLOVE ECLIPSE 8.0 STRL XLNG CF (GLOVE) ×4 IMPLANT
GLOVE EXAM NITRILE XL STR (GLOVE) IMPLANT
GLOVE SRG 8 PF TXTR STRL LF DI (GLOVE) ×2 IMPLANT
GLOVE SURG UNDER POLY LF SZ8 (GLOVE) ×2
GOWN STRL REUS W/ TWL LRG LVL3 (GOWN DISPOSABLE) IMPLANT
GOWN STRL REUS W/ TWL XL LVL3 (GOWN DISPOSABLE) ×3 IMPLANT
GOWN STRL REUS W/TWL 2XL LVL3 (GOWN DISPOSABLE) IMPLANT
GOWN STRL REUS W/TWL LRG LVL3 (GOWN DISPOSABLE)
GOWN STRL REUS W/TWL XL LVL3 (GOWN DISPOSABLE) ×3
HEMOSTAT POWDER KIT SURGIFOAM (HEMOSTASIS) ×2 IMPLANT
KIT BASIN OR (CUSTOM PROCEDURE TRAY) ×2 IMPLANT
KIT INFUSE X SMALL 1.4CC (Orthopedic Implant) ×2 IMPLANT
KIT POSITION SURG JACKSON T1 (MISCELLANEOUS) ×2 IMPLANT
KIT TURNOVER KIT B (KITS) ×2 IMPLANT
MILL MEDIUM DISP (BLADE) IMPLANT
NEEDLE HYPO 21X1.5 ECLIPSE (NEEDLE) ×2 IMPLANT
NEEDLE HYPO 25X1 1.5 SAFETY (NEEDLE) ×2 IMPLANT
NEEDLE SPNL 18GX3.5 QUINCKE PK (NEEDLE) IMPLANT
NS IRRIG 1000ML POUR BTL (IV SOLUTION) ×2 IMPLANT
OIL CARTRIDGE MAESTRO DRILL (MISCELLANEOUS) ×2
PACK LAMINECTOMY NEURO (CUSTOM PROCEDURE TRAY) ×2 IMPLANT
PAD ARMBOARD 7.5X6 YLW CONV (MISCELLANEOUS) ×6 IMPLANT
PATTIES SURGICAL .5 X.5 (GAUZE/BANDAGES/DRESSINGS) IMPLANT
PATTIES SURGICAL .5 X1 (DISPOSABLE) IMPLANT
PATTIES SURGICAL 1X1 (DISPOSABLE) IMPLANT
ROD RELIN-O LORD 5.5X65MM (Rod) ×4 IMPLANT
SCREW LOCK RELINE 5.5 TULIP (Screw) ×12 IMPLANT
SCREW RELINE-O POLY 6.5X40 (Screw) ×4 IMPLANT
SCREW RELINE-O POLY 6.5X45 (Screw) ×8 IMPLANT
SPONGE LAP 4X18 RFD (DISPOSABLE) IMPLANT
SPONGE SURGIFOAM ABS GEL 100 (HEMOSTASIS) IMPLANT
STAPLER SKIN PROX WIDE 3.9 (STAPLE) IMPLANT
SUT VIC AB 1 CT1 18XBRD ANBCTR (SUTURE) ×2 IMPLANT
SUT VIC AB 1 CT1 8-18 (SUTURE) ×2
SUT VIC AB 2-0 CT1 18 (SUTURE) ×4 IMPLANT
SUT VIC AB 3-0 SH 8-18 (SUTURE) ×4 IMPLANT
SYR 5ML LL (SYRINGE) IMPLANT
TOWEL GREEN STERILE (TOWEL DISPOSABLE) ×2 IMPLANT
TOWEL GREEN STERILE FF (TOWEL DISPOSABLE) ×2 IMPLANT
TRAY FOLEY MTR SLVR 16FR STAT (SET/KITS/TRAYS/PACK) ×2 IMPLANT
WATER STERILE IRR 1000ML POUR (IV SOLUTION) ×2 IMPLANT

## 2020-09-09 NOTE — Interval H&P Note (Signed)
History and Physical Interval Note:  09/09/2020 7:24 AM  Ashley Richard  has presented today for surgery, with the diagnosis of Spondylolisthesis, Lumbar region.  The various methods of treatment have been discussed with the patient and family. After consideration of risks, benefits and other options for treatment, the patient has consented to  Procedure(s) with comments: Lumbar 4-5 Lumbar 5-Sacral 1 Posterior lumbar interbody fusion (N/A) - 3C/RM 21 as a surgical intervention.  The patient's history has been reviewed, patient examined, no change in status, stable for surgery.  I have reviewed the patient's chart and labs.  Questions were answered to the patient's satisfaction.     Peggyann Shoals

## 2020-09-09 NOTE — Transfer of Care (Signed)
Immediate Anesthesia Transfer of Care Note  Patient: Ashley Richard  Procedure(s) Performed: Lumbar four - five  Lumbar five -Sacral one Posterior lumbar interbody fusion (N/A Spine Lumbar)  Patient Location: PACU  Anesthesia Type:General  Level of Consciousness: drowsy and patient cooperative  Airway & Oxygen Therapy: Patient Spontanous Breathing  Post-op Assessment: Report given to RN and Post -op Vital signs reviewed and stable  Post vital signs: Reviewed and stable  Last Vitals:  Vitals Value Taken Time  BP 145/69 09/09/20 1132  Temp    Pulse 81 09/09/20 1135  Resp 17 09/09/20 1135  SpO2 96 % 09/09/20 1135  Vitals shown include unvalidated device data.  Last Pain:  Vitals:   09/09/20 0610  PainSc: 0-No pain         Complications: No complications documented.

## 2020-09-09 NOTE — Anesthesia Postprocedure Evaluation (Addendum)
Anesthesia Post Note  Patient: Kaisha Wachob  Procedure(s) Performed: Lumbar four - five  Lumbar five -Sacral one Posterior lumbar interbody fusion (N/A Spine Lumbar)     Patient location during evaluation: PACU Anesthesia Type: General Level of consciousness: awake and alert and oriented Pain management: pain level controlled Vital Signs Assessment: post-procedure vital signs reviewed and stable Respiratory status: spontaneous breathing, nonlabored ventilation and respiratory function stable Cardiovascular status: blood pressure returned to baseline and stable Postop Assessment: no apparent nausea or vomiting Anesthetic complications: no   No complications documented.  Last Vitals:  Vitals:   09/09/20 1217 09/09/20 1232  BP: 124/64 (!) 122/58  Pulse: 68 67  Resp: 15 11  Temp:  36.9 C  SpO2: 97% 97%    Last Pain:  Vitals:   09/09/20 1232  PainSc: 2     LLE Motor Response: Responds to commands (09/09/20 1232) LLE Sensation: Full sensation (09/09/20 1232) RLE Motor Response: Responds to commands (09/09/20 1232) RLE Sensation: Full sensation (09/09/20 1232)      Tanyiah Laurich A.

## 2020-09-09 NOTE — Progress Notes (Signed)
Patient is awake, alert, conversant.  More feeling in legs and feet.  Full bilateral PF/DF/EHL strength.  Patient is doing well.

## 2020-09-09 NOTE — Brief Op Note (Signed)
09/09/2020  11:43 AM  PATIENT:  Ashley Richard  75 y.o. female  PRE-OPERATIVE DIAGNOSIS:  Spondylolisthesis, Lumbar region with critical stenosis and cauda equina syndrome, herniated lumbar disc, lumbago, radiculopathy L 45 and L 5 S 1 levels  POST-OPERATIVE DIAGNOSIS:  Spondylolisthesis, Lumbar region with critical stenosis and cauda equina syndrome, herniated lumbar disc, lumbago, radiculopathy L 45 and L 5 S 1 levels  PROCEDURE:  Procedure(s): Lumbar four - five  Lumbar five -Sacral one Posterior lumbar interbody fusion (N/A) with pedicle screw fixation and posterolateral arthrodesis  SURGEON:  Surgeon(s) and Role:    * Ezekial Arns, MD - Primary  PHYSICIAN ASSISTANT: McDaniel, NP  ASSISTANTS: Poteat, RN   ANESTHESIA:   general  EBL:  150 mL   BLOOD ADMINISTERED:none  DRAINS: none   LOCAL MEDICATIONS USED:  MARCAINE    and LIDOCAINE   SPECIMEN:  No Specimen  DISPOSITION OF SPECIMEN:  N/A  COUNTS:  YES  TOURNIQUET:  * No tourniquets in log *  DICTATION: Patient is 75-year-old woman with  HNP, spondylolisthesis stenosis, DDD, radiculopathy L4/5, L5/S1. She has a cauda equina and weakness. It was elected to take her to surgery for decompression and fusion at L4/5 and L5/S1 levels.    Procedure: Patient was placed in a prone position on the Jackson table after smooth and uncomplicated induction of general endotracheal anesthesia. Her low back was prepped and draped in usual sterile fashion with betadine scrub and DuraPrep after marking relevant anatomy with C arm. Area of incision was infiltrated with local lidocaine. Incision was made to the lumbodorsal fascia was incised and exposure was performed of the L4/5, L5/S1 spinous processes laminae facet joint and transverse processes. Intraoperative x-ray was obtained which confirmed correct orientation. A total laminectomy of L4 and L5 was performed with disarticulation of the facet joints at this level and thorough decompression was  performed of both L4, L5 and S1 nerve roots along with the common dural tube. There was densely adherent spondylytic material compressing the thecal sac and both L4 and L5 nerve roots.  Decompression was greater than would be typically performed for simple interbody fusion. A thorough discectomy and preparation of the endplates was performed at both the L4/5 and L5/S1 levels.  The interspaces were packed with extra small BMP, bone autograft, and PEEK cages (9 x 9 x 23 mm x 8 degrees at L5/S1 and a similar size at L4/5) . Bone autograft was packed within the interspace bilaterally along with extra small BMP kit. 6 cc of autograft was placed in the L 45 interspace along with an 9mm PEEK PLIF spacer. After a thorough decompression with bilateral discectomy at L 5 S 1, bilateral 9 mm mm peek cages were packed with BMP and extender and was inserted the interspace and countersunk appropriately with a similar amount of autograft. The posterolateral region was extensively decorticated and pedicle probes were placed at L4, L5 and S1 bilaterally. Intraoperative navigation with the O arm  confirmed correct orientationin the AP and lateral plane. 40 x 6.5 mm pedicle screws were placed at S1 bilaterally and 45 x 6.5 mm screws placed at L5 bilaterally and 45x 6.5mm screws were placed at L4 bilaterally.  Final x-rays demonstrated well-positioned interbody grafts and pedicle screw fixation. 65 mm lordotic rods were placed and locked down in situ and the posterolateral region was packed with the remaining 20 cc bone graft  on the right with allograft chips and 20 cc chips on the left.   Fascia was   closed with 1 Vicryl sutures skin edges were reapproximated 2 and 3-0 Vicryl sutures. The deep musculature was infiltrated with long-acting Marcaine.  The wound is dressed with Dermabond and an occlusive dressing. The patient was extubated in the operating room and taken to recovery in stable satisfactory condition having tolerated the  operation well. Counts were correct at the end of the case.    PLAN OF CARE: Admit to inpatient   PATIENT DISPOSITION:  PACU - hemodynamically stable.   Delay start of Pharmacological VTE agent (>24hrs) due to surgical blood loss or risk of bleeding: yes  

## 2020-09-09 NOTE — Op Note (Signed)
09/09/2020  11:43 AM  PATIENT:  Ashley Richard  75 y.o. female  PRE-OPERATIVE DIAGNOSIS:  Spondylolisthesis, Lumbar region with critical stenosis and cauda equina syndrome, herniated lumbar disc, lumbago, radiculopathy L 45 and L 5 S 1 levels  POST-OPERATIVE DIAGNOSIS:  Spondylolisthesis, Lumbar region with critical stenosis and cauda equina syndrome, herniated lumbar disc, lumbago, radiculopathy L 45 and L 5 S 1 levels  PROCEDURE:  Procedure(s): Lumbar four - five  Lumbar five -Sacral one Posterior lumbar interbody fusion (N/A) with pedicle screw fixation and posterolateral arthrodesis  SURGEON:  Surgeon(s) and Role:    Erline Levine, MD - Primary  PHYSICIAN ASSISTANT: Glenford Peers, NP  ASSISTANTS: P[place], RN   ANESTHESIA:   general  EBL:  150 mL   BLOOD ADMINISTERED:none  DRAINS: none   LOCAL MEDICATIONS USED:  MARCAINE    and LIDOCAINE   SPECIMEN:  No Specimen  DISPOSITION OF SPECIMEN:  N/A  COUNTS:  YES  TOURNIQUET:  * No tourniquets in log *  DICTATION: Patient is 75 year old woman with  HNP, spondylolisthesis stenosis, DDD, radiculopathy L4/5, L5/S1. She has a cauda equina and weakness. It was elected to take her to surgery for decompression and fusion at L4/5 and L5/S1 levels.    Procedure: Patient was placed in a prone position on the [place] table after smooth and uncomplicated induction of general endotracheal anesthesia. Her low back was prepped and draped in usual sterile fashion with betadine scrub and DuraPrep after marking relevant anatomy with C arm. Area of incision was infiltrated with local lidocaine. Incision was made to the lumbodorsal fascia was incised and exposure was performed of the L4/5, L5/S1 spinous processes laminae facet joint and transverse processes. Intraoperative x-ray was obtained which confirmed correct orientation. A total laminectomy of L4 and L5 was performed with disarticulation of the facet joints at this level and thorough decompression was  performed of both L4, L5 and S1 nerve roots along with the common dural tube. There was densely adherent spondylytic material compressing the thecal sac and both L4 and L5 nerve roots.  Decompression was greater than would be typically performed for simple interbody fusion. A thorough discectomy and preparation of the endplates was performed at both the L4/5 and L5/S1 levels.  The interspaces were packed with extra small BMP, bone autograft, and PEEK cages (9 x 9 x 23 mm x 8 degrees at L5/S1 and a similar size at L4/5) . Bone autograft was packed within the interspace bilaterally along with extra small BMP kit. 6 cc of autograft was placed in the L 45 interspace along with an 85m PEEK PLIF spacer. After a thorough decompression with bilateral discectomy at L 5 S 1, bilateral 9 mm mm peek cages were packed with BMP and extender and was inserted the interspace and countersunk appropriately with a similar amount of autograft. The posterolateral region was extensively decorticated and pedicle probes were placed at L4, L5 and S1 bilaterally. Intraoperative navigation with the O arm  confirmed correct orientationin the AP and lateral plane. 40 x 6.5 mm pedicle screws were placed at S1 bilaterally and 45 x 6.5 mm screws placed at L5 bilaterally and 45x 6.534mscrews were placed at L4 bilaterally.  Final x-rays demonstrated well-positioned interbody grafts and pedicle screw fixation. 65 mm lordotic rods were placed and locked down in situ and the posterolateral region was packed with the remaining 20 cc bone graft  on the right with allograft chips and 20 cc chips on the left.   Fascia was  closed with 1 Vicryl sutures skin edges were reapproximated 2 and 3-0 Vicryl sutures. The deep musculature was infiltrated with long-acting Marcaine.  The wound is dressed with Dermabond and an occlusive dressing. The patient was extubated in the operating room and taken to recovery in stable satisfactory condition having tolerated the  operation well. Counts were correct at the end of the case.    PLAN OF CARE: Admit to inpatient   PATIENT DISPOSITION:  PACU - hemodynamically stable.   Delay start of Pharmacological VTE agent (>24hrs) due to surgical blood loss or risk of bleeding: yes

## 2020-09-09 NOTE — Anesthesia Procedure Notes (Signed)
Procedure Name: Intubation Date/Time: 09/09/2020 7:40 AM Performed by: Georgia Duff, CRNA Pre-anesthesia Checklist: Patient identified, Emergency Drugs available, Suction available and Patient being monitored Patient Re-evaluated:Patient Re-evaluated prior to induction Oxygen Delivery Method: Circle System Utilized Preoxygenation: Pre-oxygenation with 100% oxygen Induction Type: IV induction Ventilation: Mask ventilation without difficulty Laryngoscope Size: Mac and 3 Grade View: Grade I Tube type: Oral Tube size: 7.0 mm Number of attempts: 1 Airway Equipment and Method: Stylet and Oral airway Placement Confirmation: ETT inserted through vocal cords under direct vision,  positive ETCO2 and breath sounds checked- equal and bilateral Secured at: 21 cm Tube secured with: Tape Dental Injury: Teeth and Oropharynx as per pre-operative assessment  Comments: Placed by EMT student

## 2020-09-10 MED ORDER — HYDROCODONE-ACETAMINOPHEN 5-325 MG PO TABS: 1.0000 | ORAL_TABLET | Freq: Four times a day (QID) | ORAL | 0 refills | Status: AC | PRN

## 2020-09-10 MED ORDER — METHOCARBAMOL 500 MG PO TABS: 500.0000 mg | ORAL_TABLET | Freq: Four times a day (QID) | ORAL | 0 refills | Status: AC

## 2020-09-10 NOTE — Discharge Summary (Signed)
Physician Discharge Summary  Patient ID: Mailen Newborn MRN: [id number] DOB/AGE: Jun 24, 1945 75 y.o.  Admit date: 09/09/2020 Discharge date: 09/10/2020  Admission Diagnoses: Spondylolisthesis, Lumbar region with critical stenosis and cauda equina syndrome, herniated lumbar disc, lumbago, radiculopathy L 45 and L 5 S 1 levels   Discharge Diagnoses: same   Discharged Condition: good  Hospital Course: The patient was admitted on 09/09/2020 and taken to the operating room where the patient underwent PLIF L4-5, L5-S1. The patient tolerated the procedure well and was taken to the recovery room and then to the floor in stable condition. The hospital course was routine. There were no complications. The wound remained clean dry and intact. Pt had appropriate back soreness. No complaints of leg pain or new N/T/W. The patient remained afebrile with stable vital signs, and tolerated a regular diet. The patient continued to increase activities, and pain was well controlled with oral pain medications.   Consults: None  Significant Diagnostic Studies:  Results for orders placed or performed during the hospital encounter of 09/09/20  ABO/Rh  Result Value Ref Range   ABO/RH(D)      O NEG Performed at [facility] [address], [place], [place] [address]     DG Lumbar Spine 2-3 Views  Result Date: 09/09/2020 CLINICAL DATA:  L4-S1 PLIF. EXAM: DG C-ARM 1-60 MIN; LUMBAR SPINE - 2-3 VIEW FLUOROSCOPY TIME:  Fluoroscopy Time:  20 seconds. Radiation Exposure Index (if provided by the fluoroscopic device): 18.08 mGy. Number of Acquired Spot Images: 3. COMPARISON:  MRI lumbar spine July 27, 2020 FINDINGS: Three C-arm fluoroscopic images were obtained intraoperatively and submitted for post operative interpretation. These images demonstrate bilateral pedicle screws at L4, L5 and S1. Intervening spacers at L4-L5 and L5-S1. No unexpected findings. Please see the performing provider's procedural report for further  detail. IMPRESSION: Intraoperative fluoroscopy, as detailed above. Electronically Signed   By: Margaretha Sheffield MD   On: 09/09/2020 11:39   DG C-Arm 1-60 Min  Result Date: 09/09/2020 CLINICAL DATA:  L4-S1 PLIF. EXAM: DG C-ARM 1-60 MIN; LUMBAR SPINE - 2-3 VIEW FLUOROSCOPY TIME:  Fluoroscopy Time:  20 seconds. Radiation Exposure Index (if provided by the fluoroscopic device): 18.08 mGy. Number of Acquired Spot Images: 3. COMPARISON:  MRI lumbar spine July 27, 2020 FINDINGS: Three C-arm fluoroscopic images were obtained intraoperatively and submitted for post operative interpretation. These images demonstrate bilateral pedicle screws at L4, L5 and S1. Intervening spacers at L4-L5 and L5-S1. No unexpected findings. Please see the performing provider's procedural report for further detail. IMPRESSION: Intraoperative fluoroscopy, as detailed above. Electronically Signed   By: Margaretha Sheffield MD   On: 09/09/2020 11:39   DG C-Arm 1-60 Min-No Report  Result Date: 09/09/2020 Fluoroscopy was utilized by the requesting physician.  No radiographic interpretation.    Antibiotics:  Anti-infectives (From admission, onward)   Start     Dose/Rate Route Frequency Ordered Stop   09/09/20 1830  vancomycin (VANCOREADY) IVPB 1000 mg/200 mL        1,000 mg 200 mL/hr over 60 Minutes Intravenous  Once 09/09/20 1326 09/09/20 1924   09/09/20 0600  vancomycin (VANCOCIN) IVPB 1000 mg/200 mL premix        1,000 mg 200 mL/hr over 60 Minutes Intravenous On call to O.R. 09/09/20 0557 09/09/20 0730      Discharge Exam: Blood pressure 136/65, pulse 76, temperature 98.1 F (36.7 C), temperature source Oral, resp. rate 18, height 5' 3.5" (1.613 m), weight 83.9 kg, SpO2 98 %.  Neurologic: Grossly normal Ambulating and voiding well, incision cdi   Discharge Medications:   Allergies as of 09/10/2020      Reactions   Clindamycin Other (See Comments)   chest pain   Codeine Other (See Comments)   GI Upset   Penicillins Rash    Did it involve swelling of the face/tongue/throat, SOB, or low BP? No Did it involve sudden or severe rash/hives, skin peeling, or any reaction on the inside of your mouth or nose? No Did you need to seek medical attention at a hospital or doctor's office? No When did it last happen?Over 10 years If all above answers are "NO", may proceed with cephalosporin use.   Ciprofloxacin Rash      Medication List    TAKE these medications   aspirin EC 81 MG tablet Take 81 mg by mouth at bedtime. Swallow whole.   atorvastatin 10 MG tablet Commonly known as: LIPITOR Take 10 mg by mouth every evening.   CALCIUM 1200+D3 PO Take 1 tablet by mouth in the morning and at bedtime.   carvedilol 25 MG tablet Commonly known as: COREG Take 25 mg by mouth 2 (two) times daily.   ferrous sulfate 325 (65 FE) MG tablet Take 325 mg by mouth in the morning.   FISH OIL DOUBLE STRENGTH PO Take 1 capsule by mouth in the morning.   hydrochlorothiazide 25 MG tablet Commonly known as: HYDRODIURIL Take 25 mg by mouth in the morning.   HYDROcodone-acetaminophen 5-325 MG tablet Commonly known as: NORCO/VICODIN Take 1 tablet by mouth every 6 (six) hours as needed for moderate pain.   lansoprazole 30 MG capsule Commonly known as: PREVACID Take 30 mg by mouth in the morning.   lisinopril 40 MG tablet Commonly known as: ZESTRIL Take 40 mg by mouth in the morning.   methocarbamol 500 MG tablet Commonly known as: Robaxin Take 1 tablet (500 mg total) by mouth 4 (four) times daily.   multivitamin with minerals tablet Take 1 tablet by mouth in the morning.   polyethylene glycol 17 g packet Commonly known as: MIRALAX / GLYCOLAX Take 17 g by mouth at bedtime.       Disposition: home   Final Dx: PLIF L4-5, L5-S1  Discharge Instructions     Remove dressing in 72 hours   Complete by: As directed    Call MD for:  difficulty breathing, headache or visual disturbances   Complete by: As  directed    Call MD for:  hives   Complete by: As directed    Call MD for:  persistant nausea and vomiting   Complete by: As directed    Call MD for:  redness, tenderness, or signs of infection (pain, swelling, redness, odor or green/yellow discharge around incision site)   Complete by: As directed    Call MD for:  severe uncontrolled pain   Complete by: As directed    Call MD for:  temperature >100.4   Complete by: As directed    Diet - low sodium heart healthy   Complete by: As directed    Driving Restrictions   Complete by: As directed    No driving for 2 weeks, no riding in the car for 1 week   Increase activity slowly   Complete by: As directed    Lifting restrictions   Complete by: As directed    No lifting more than 8 lbs         Signed: Ocie Cornfield Shareeka Yim 09/10/2020, 8:06 AM

## 2020-09-10 NOTE — Discharge Instructions (Signed)
Wound Care  Remove outer dressing in 2 -3 days Leave incision open to air. You may shower. Do not scrub directly on incision.  Do not put any creams, lotions, or ointments on incision. Activity Walk each and every day, increasing distance each day. No lifting greater than 5 lbs.  Avoid bending, arching, and twisting. No driving for 2 weeks; may ride as a passenger locally. If provided with back brace, wear when out of bed.  It is not necessary to wear in bed. Diet Resume your normal diet.  Return to Work Will be discussed at you follow up appointment. Call Your Doctor If Any of These Occur Redness, drainage, or swelling at the wound.  Temperature greater than 101 degrees. Severe pain not relieved by pain medication. Incision starts to come apart. Follow Up Appt Call today for appointment in 3 weeks ([phone]) or for problems.  If you have any hardware placed in your spine, you will need an x-ray before your appointment.

## 2020-09-10 NOTE — Evaluation (Signed)
Occupational Therapy Evaluation Patient Details Name: Ashley Richard MRN: [id number] DOB: 03-Oct-1945 Today's Date: 09/10/2020    History of Present Illness 75 yo female dx Spondylolisthesis, Lumbar region with critical stenosis and cauda equina syndrome, herniated lumbar disc, lumbago, radiculopathy L 45 and L 5 S 1 levels. Admitted for 09/09/20  Lumbar four - five  Lumbar five -Sacral one Posterior lumbar interbody fusion (N/A) with pedicle screw fixation and posterolateral arthrodesis. PMHx: CHF, HTN.   Clinical Impression   Pt PTA: Pt living with family and reports no assist for ADL and mobility. Pt currently, pt supervisionA for mobility with RW in room and for OOB ADL. Pt performing ADL routine and donning brace with supervisionA. Pt performing grooming at sink with no physical assist and simulating shower transfer with supervisionA.  Back handout provided and reviewed ADL in detail. Pt's pain 5/10. Pt educated on: clothing between brace, never sleep in brace, set an alarm at night for medication, avoid sitting for long periods of time, correct bed positioning for sleeping, correct sequence for bed mobility, avoiding lifting more than 5 pounds and never wash directly over incision. All education is complete and patient indicates understanding. OT signing off.     Follow Up Recommendations  No OT follow up;Supervision - Intermittent    Equipment Recommendations  None recommended by OT    Recommendations for Other Services       Precautions / Restrictions Precautions Precautions: Back Precaution Booklet Issued: Yes (comment) Precaution Comments: able to state 3/3 precautions and maintain precautions  with ADL/mobility Required Braces or Orthoses: Spinal Brace Spinal Brace: Applied in sitting position (pt able to don and dof independently) Restrictions Weight Bearing Restrictions: No      Mobility Bed Mobility Overal bed mobility: Needs Assistance Bed Mobility: Rolling;Sidelying to  Sit;Sit to Sidelying Rolling: Supervision Sidelying to sit: Supervision     Sit to sidelying: Supervision General bed mobility comments: pt performing without physical assist    Transfers Overall transfer level: Needs assistance Equipment used: None Transfers: Sit to/from Stand Sit to Stand: Supervision         General transfer comment: RW in front; no physical assist    Balance Overall balance assessment: Mild deficits observed, not formally tested                                         ADL either performed or assessed with clinical judgement   ADL Overall ADL's : At baseline;Modified independent                                       General ADL Comments: Pt performing ADL routine: Pt performing LB dressing sitting EOB using figure 4 technique with no cues for maintaining back precautions. Pt donning brace with no assist. Pt performing grooming at sink with no physical assist and simulating shower transfer with supervisionA.  Family advised to be present for shower transfers.     Vision Baseline Vision/History: No visual deficits Patient Visual Report: No change from baseline Vision Assessment?: No apparent visual deficits     Perception     Praxis      Pertinent Vitals/Pain Pain Assessment: 0-10 Pain Score: 2  Pain Location: low back Pain Descriptors / Indicators: Aching;Sore Pain Intervention(s): Limited activity within patient's tolerance;Monitored during session;Repositioned  Hand Dominance Right   Extremity/Trunk Assessment Upper Extremity Assessment Upper Extremity Assessment: Overall WFL for tasks assessed   Lower Extremity Assessment Lower Extremity Assessment: Overall WFL for tasks assessed   Cervical / Trunk Assessment Cervical / Trunk Assessment: Other exceptions Cervical / Trunk Exceptions: s/p L4-5 fusion   Communication Communication Communication: No difficulties   Cognition Arousal/Alertness:  Awake/alert Behavior During Therapy: WFL for tasks assessed/performed Overall Cognitive Status: Within Functional Limits for tasks assessed                                     General Comments  Pt's spouse in room    Exercises     Shoulder Instructions      Home Living Family/patient expects to be discharged to:: Private residence Living Arrangements: Spouse/significant other Available Help at Discharge: Family;Available 24 hours/day Type of Home: House Home Access: Stairs to enter [employer] of Steps: 1   Home Layout: One level     Bathroom Shower/Tub: Occupational psychologist: Standard Bathroom Accessibility: Yes   Home Equipment: Environmental consultant - 2 wheels;Toilet riser          Prior Functioning/Environment Level of Independence: Independent        Comments: needed to use cart for stability in walking in grocery store.        OT Problem List: Decreased activity tolerance;Pain      OT Treatment/Interventions:      OT Goals(Current goals can be found in the care plan section) Acute Rehab OT Goals Patient Stated Goal: have less pain OT Goal Formulation: All assessment and education complete, DC therapy  OT Frequency: Min 2X/week   Barriers to D/C:            Co-evaluation              AM-PAC OT "6 Clicks" Daily Activity     Outcome Measure Help from another person eating meals?: None Help from another person taking care of personal grooming?: None Help from another person toileting, which includes using toliet, bedpan, or urinal?: None Help from another person bathing (including washing, rinsing, drying)?: A Little Help from another person to put on and taking off regular upper body clothing?: None Help from another person to put on and taking off regular lower body clothing?: A Little 6 Click Score: 22   End of Session Equipment Utilized During Treatment: Rolling walker;Back brace Nurse Communication: Mobility  status;Precautions  Activity Tolerance: Patient tolerated treatment well Patient left: in bed;with call bell/phone within reach;with family/visitor present  OT Visit Diagnosis: Unsteadiness on feet (R26.81);Pain Pain - part of body:  (back)                Time: 1062-6948 OT Time Calculation (min): 17 min Charges:  OT General Charges $OT Visit: 1 Visit OT Evaluation $OT Eval Moderate Complexity: 1 Mod  Jefferey Pica, OTR/L Acute Rehabilitation Services Pager: [phone] Office: [place] [facility] 09/10/2020, 10:44 AM

## 2020-09-10 NOTE — Evaluation (Signed)
Physical Therapy Evaluation Patient Details Name: Ashley Richard MRN: [id number] DOB: Dec 25, 1945 Today's Date: 09/10/2020   History of Present Illness  75 yo female dx Spondylolisthesis, Lumbar region with critical stenosis and cauda equina syndrome, herniated lumbar disc, lumbago, radiculopathy L 45 and L 5 S 1 levels. Admitted for 09/09/20  Lumbar four - five  Lumbar five -Sacral one Posterior lumbar interbody fusion (N/A) with pedicle screw fixation and posterolateral arthrodesis. PMHx: CHF, HTN.  Clinical Impression  PTA pt living with husband in single story home with one step to enter. Pt reports independence in household ambulation, bADLs, and iADLs. Pt utilized cart to ambulate in grocery store. Pt is currently limited in safe mobility by back precautions, and back pain, in presence of generalized weakness. Pt is supervision for bed mobility, transfers and ambulation of 200 feet with RW. Pt provided education on back precautions. Pt will not have any additional PT or equipment needs at d/c, however PT will continue to follow acutely if pt does not d/c home today.      Follow Up Recommendations No PT follow up;Follow surgeon's recommendation for DC plan and follow-up therapies    Equipment Recommendations  None recommended by PT    Recommendations for Other Services OT consult     Precautions / Restrictions Precautions Precautions: Back Precaution Booklet Issued: Yes (comment) Required Braces or Orthoses: Spinal Brace Spinal Brace: Applied in sitting position (pt able to don and dof independently) Restrictions Weight Bearing Restrictions: No      Mobility  Bed Mobility Overal bed mobility: Needs Assistance Bed Mobility: Rolling;Sidelying to Sit;Sit to Sidelying Rolling: Supervision;Min assist Sidelying to sit: Supervision;Min assist     Sit to sidelying: Supervision General bed mobility comments: with initial rolling and push up for sidelying, pt given verbal and tactile cues,  with 2nd attempt pt only required supervision    Transfers Overall transfer level: Needs assistance Equipment used: None Transfers: Sit to/from Stand Sit to Stand: Min guard;Supervision         General transfer comment: min guard for intial power up and steadying before reaching for RW, second trial pt supervision for safety  Ambulation/Gait Ambulation/Gait assistance: Min guard;Supervision Gait Distance (Feet): 200 Feet Assistive device: Rolling walker (2 wheeled) Gait Pattern/deviations: Step-through pattern;Decreased step length - right;Decreased step length - left Gait velocity: slowed Gait velocity interpretation: <1.8 ft/sec, indicate of risk for recurrent falls General Gait Details: min guard progressing to supervision for slow, steady gait, pt reports feeling weak in LE by end of ambulation        Balance Overall balance assessment: Mild deficits observed, not formally tested                                           Pertinent Vitals/Pain Pain Assessment: 0-10 Pain Score: 2  Pain Location: low back Pain Descriptors / Indicators: Aching;Sore Pain Intervention(s): Limited activity within patient's tolerance;Monitored during session;Repositioned    Home Living Family/patient expects to be discharged to:: Private residence Living Arrangements: Spouse/significant other Available Help at Discharge: Family;Available 24 hours/day Type of Home: House Home Access: Stairs to enter   [employer] of Steps: 1 Home Layout: One level Home Equipment: Walker - 2 wheels;Toilet riser      Prior Function Level of Independence: Independent         Comments: needed to use cart for stability in walking in grocery store.  Extremity/Trunk Assessment   Upper Extremity Assessment Upper Extremity Assessment: Defer to OT evaluation    Lower Extremity Assessment Lower Extremity Assessment: Generalized weakness    Cervical / Trunk  Assessment Cervical / Trunk Assessment: Other exceptions Cervical / Trunk Exceptions: s/p L4-5 fusion  Communication   Communication: No difficulties  Cognition Arousal/Alertness: Awake/alert Behavior During Therapy: WFL for tasks assessed/performed Overall Cognitive Status: Within Functional Limits for tasks assessed                                        General Comments General comments (skin integrity, edema, etc.): pt husband in room throughout session, VSS on RA        Assessment/Plan    PT Assessment Patent does not need any further PT services  PT Problem List Decreased activity tolerance;Decreased mobility;Pain       PT Treatment Interventions DME instruction;Gait training;Stair training;Functional mobility training;Therapeutic activities;Therapeutic exercise;Balance training;Cognitive remediation;Patient/family education    PT Goals (Current goals can be found in the Care Plan section)  Acute Rehab PT Goals Patient Stated Goal: have less pain PT Goal Formulation: With patient/family Time For Goal Achievement: 09/24/20 Potential to Achieve Goals: Good    Frequency Min 3X/week    AM-PAC PT "6 Clicks" Mobility  Outcome Measure Help needed turning from your back to your side while in a flat bed without using bedrails?: None Help needed moving from lying on your back to sitting on the side of a flat bed without using bedrails?: None Help needed moving to and from a bed to a chair (including a wheelchair)?: None Help needed standing up from a chair using your arms (e.g., wheelchair or bedside chair)?: None Help needed to walk in hospital room?: None Help needed climbing 3-5 steps with a railing? : A Little 6 Click Score: 23    End of Session Equipment Utilized During Treatment: Back brace Activity Tolerance: Patient tolerated treatment well Patient left: in chair;with call bell/phone within reach;with family/visitor present Nurse Communication:  Mobility status PT Visit Diagnosis: Muscle weakness (generalized) (M62.81);Difficulty in walking, not elsewhere classified (R26.2);Pain Pain - part of body:  (low back)    Time: 3710-6269 PT Time Calculation (min) (ACUTE ONLY): 20 min   Charges:   PT Evaluation $PT Eval Moderate Complexity: 1 Mod          Ashley Brothers B. Migdalia Dk PT, DPT Acute Rehabilitation Services Pager [phone] Office [phone]   [place] 09/10/2020, 9:22 AM

## 2020-09-10 NOTE — Progress Notes (Signed)
Patient alert and oriented, voiding adequately, MAE well with no difficulty. Incision area cdi with no s/s of infection. Patient discharged home per order. Patient and husband stated understanding of discharge instructions given. Patient has an appointment with Dr. Vertell Limber in 2 weeks

## 2020-09-13 MED FILL — Sodium Chloride IV Soln 0.9%: INTRAVENOUS | Qty: 1000 | Status: AC

## 2020-09-13 MED FILL — Heparin Sodium (Porcine) Inj 1000 Unit/ML: INTRAMUSCULAR | Qty: 30 | Status: AC

## 2022-07-03 IMAGING — RF DG C-ARM 1-60 MIN
1 series · 3 of 3 positions shown · non-contrast
Comparison: MRI lumbar spine July 27, 2020

CLINICAL DATA: L4-S1 PLIF.

EXAM:
DG C-ARM 1-60 MIN; LUMBAR SPINE - 2-3 VIEW
FLUOROSCOPY TIME:  Fluoroscopy Time:  20 seconds.
Radiation Exposure Index (if provided by the fluoroscopic device):
18.08 mGy.
Number of Acquired Spot Images: 3.

[Series 1: run · 3 of 3 slices shown]
[im 1/3]
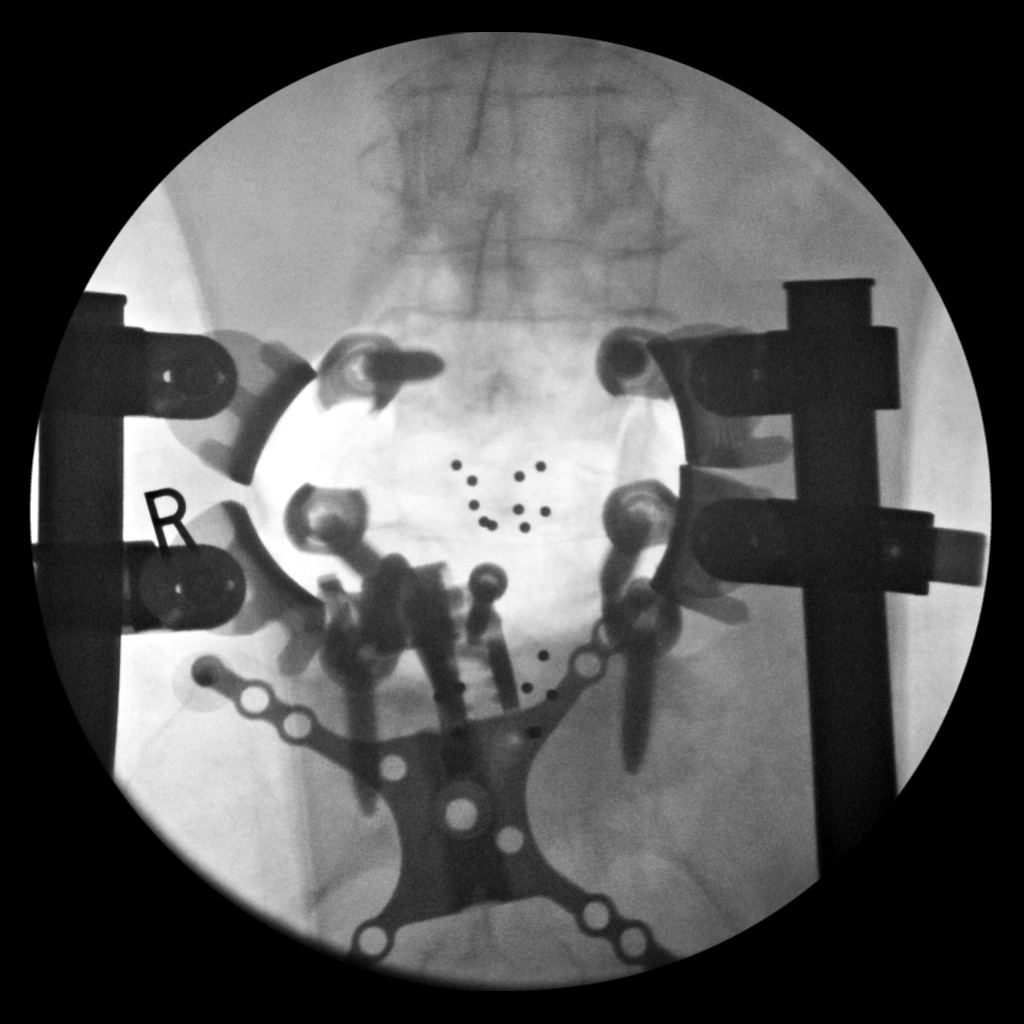
[im 2/3]
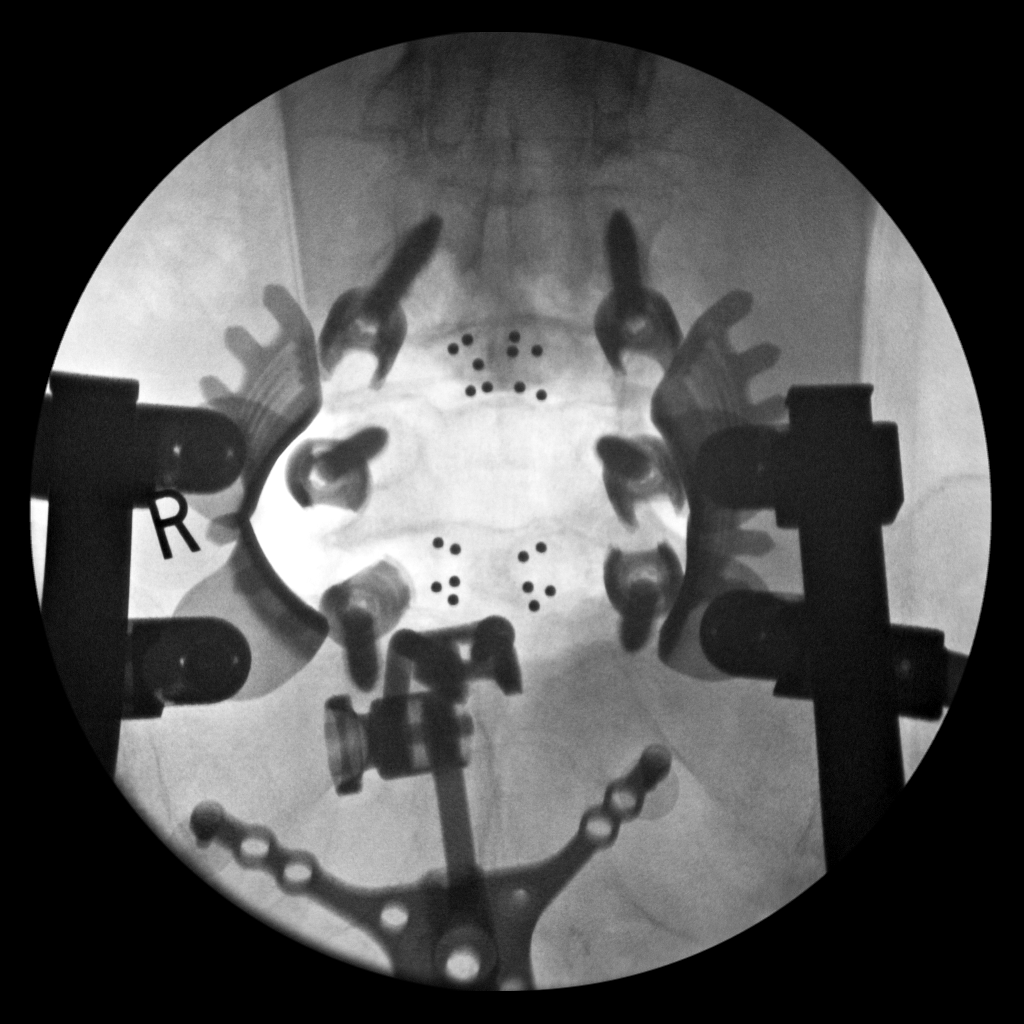
[im 3/3]
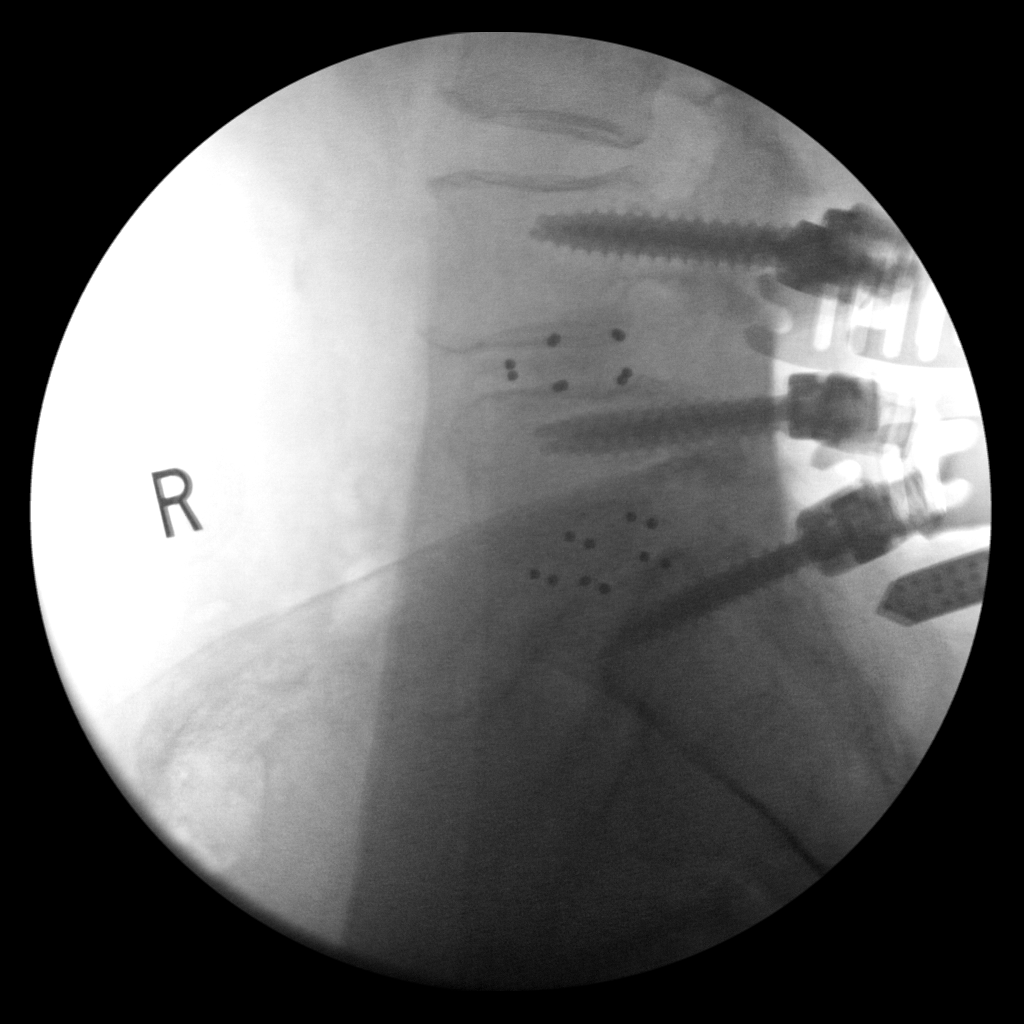

[3 of 3 positions shown; findings below may reference images not displayed]

FINDINGS: Three C-arm fluoroscopic images were obtained intraoperatively and
submitted for post operative interpretation. These images
demonstrate bilateral pedicle screws at L4, L5 and S1. Intervening
spacers at L4-L5 and L5-S1. No unexpected findings. Please see the
performing provider's procedural report for further detail.
IMPRESSION: Intraoperative fluoroscopy, as detailed above.

## 2022-07-03 IMAGING — RF DG LUMBAR SPINE 2-3V
1 series · 3 of 3 positions shown · non-contrast
Comparison: MRI lumbar spine July 27, 2020

CLINICAL DATA: L4-S1 PLIF.

EXAM:
DG C-ARM 1-60 MIN; LUMBAR SPINE - 2-3 VIEW
FLUOROSCOPY TIME:  Fluoroscopy Time:  20 seconds.
Radiation Exposure Index (if provided by the fluoroscopic device):
18.08 mGy.
Number of Acquired Spot Images: 3.

[Series 1: run · 3 of 3 slices shown]
[im 1/3]
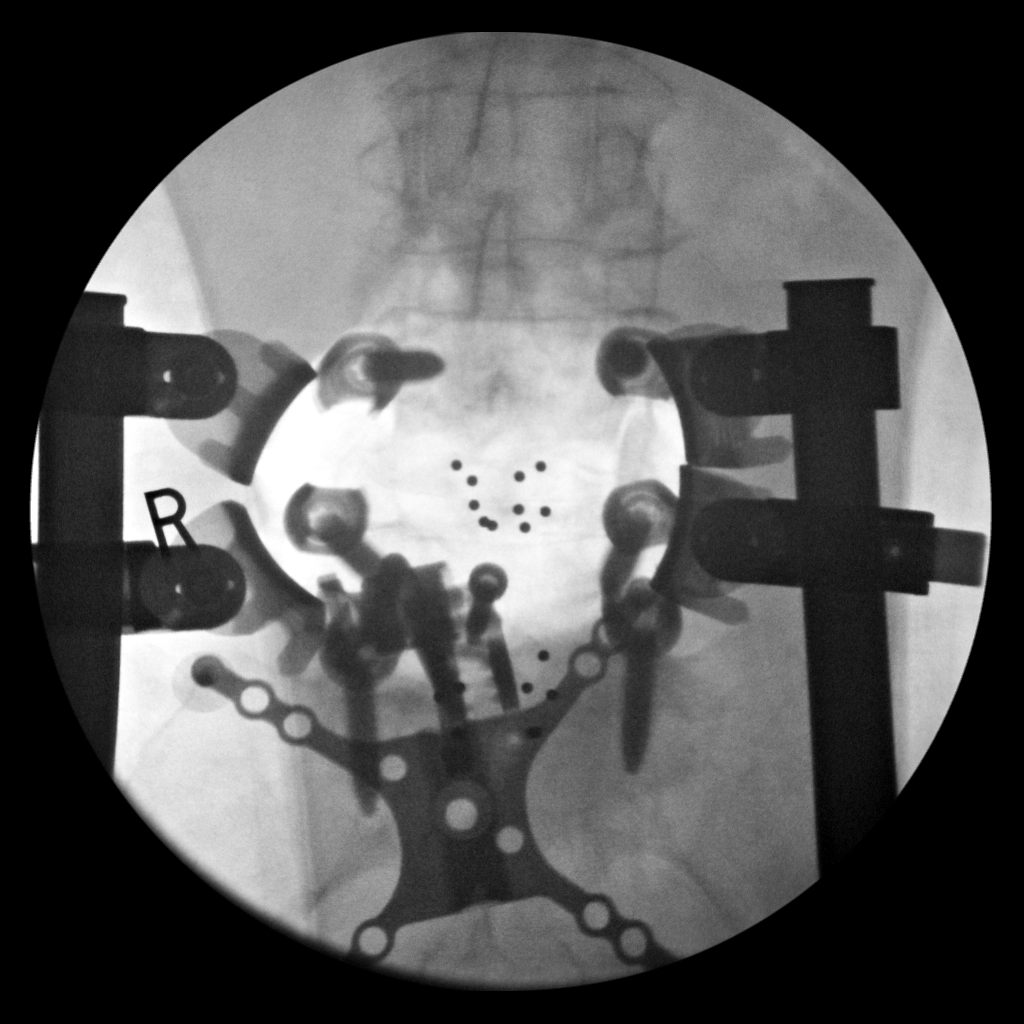
[im 2/3]
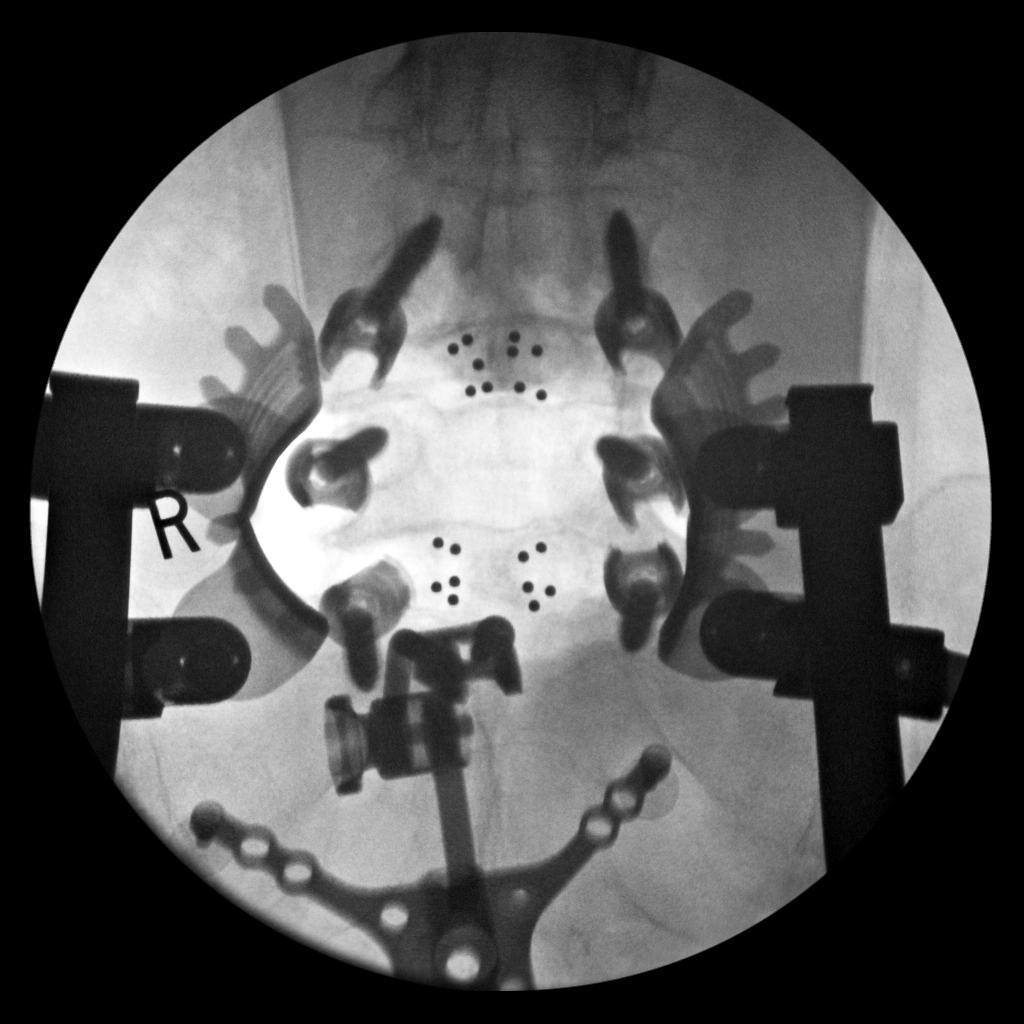
[im 3/3]
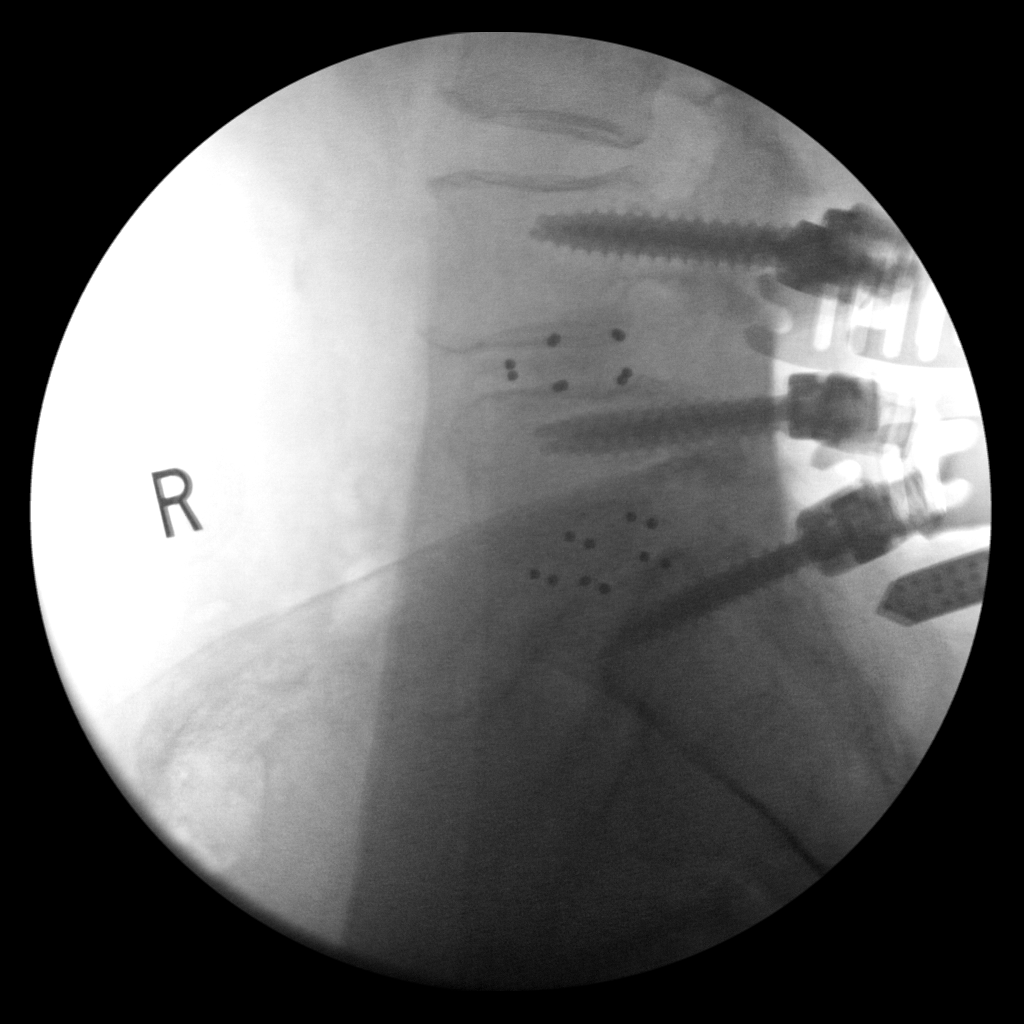

[3 of 3 positions shown; findings below may reference images not displayed]

FINDINGS: Three C-arm fluoroscopic images were obtained intraoperatively and
submitted for post operative interpretation. These images
demonstrate bilateral pedicle screws at L4, L5 and S1. Intervening
spacers at L4-L5 and L5-S1. No unexpected findings. Please see the
performing provider's procedural report for further detail.
IMPRESSION: Intraoperative fluoroscopy, as detailed above.

## 2024-03-26 ENCOUNTER — Encounter (INDEPENDENT_AMBULATORY_CARE_PROVIDER_SITE_OTHER): Payer: Self-pay | Admitting: *Deleted
